# Patient Record
Sex: Male | Born: 1990 | Race: Black or African American | Hispanic: No | Marital: Single | Smoking: Current every day smoker
Health system: Southern US, Community
[De-identification: ages and names within clinical notes are randomized; demographics above are authoritative.]

## PROBLEM LIST (undated history)

## (undated) DIAGNOSIS — F32A Depression, unspecified: Secondary | ICD-10-CM

## (undated) DIAGNOSIS — N200 Calculus of kidney: Secondary | ICD-10-CM

## (undated) DIAGNOSIS — H409 Unspecified glaucoma: Secondary | ICD-10-CM

## (undated) DIAGNOSIS — F319 Bipolar disorder, unspecified: Secondary | ICD-10-CM

---

## 2009-11-12 ENCOUNTER — Emergency Department: Payer: Self-pay | Admitting: Emergency Medicine

## 2010-11-05 ENCOUNTER — Emergency Department: Payer: Self-pay | Admitting: Emergency Medicine

## 2010-11-16 ENCOUNTER — Inpatient Hospital Stay: Payer: Self-pay | Admitting: Unknown Physician Specialty

## 2010-11-23 ENCOUNTER — Emergency Department: Payer: Self-pay | Admitting: Emergency Medicine

## 2011-08-14 ENCOUNTER — Emergency Department: Payer: Self-pay | Admitting: Emergency Medicine

## 2011-08-14 LAB — COMPREHENSIVE METABOLIC PANEL
Albumin: 4.5 g/dL (ref 3.4–5.0)
Anion Gap: 11 (ref 7–16)
BUN: 9 mg/dL (ref 7–18)
Bilirubin,Total: 2.1 mg/dL — ABNORMAL HIGH (ref 0.2–1.0)
Calcium, Total: 9.2 mg/dL (ref 8.5–10.1)
Chloride: 103 mmol/L (ref 98–107)
Co2: 28 mmol/L (ref 21–32)
Creatinine: 1 mg/dL (ref 0.60–1.30)
EGFR (African American): >60
Glucose: 88 mg/dL (ref 65–99)
Osmolality: 281 (ref 275–301)
Potassium: 3.8 mmol/L (ref 3.5–5.1)
SGOT(AST): 18 U/L (ref 15–37)
Sodium: 142 mmol/L (ref 136–145)
Total Protein: 7.6 g/dL (ref 6.4–8.2)

## 2011-08-14 LAB — CBC
HCT: 39.7 % — ABNORMAL LOW (ref 40.0–52.0)
HGB: 13.3 g/dL (ref 13.0–18.0)
MCH: 32.7 pg (ref 26.0–34.0)
MCHC: 33.4 g/dL (ref 32.0–36.0)
Platelet: 260 10*3/uL (ref 150–440)
RBC: 4.05 10*6/uL — ABNORMAL LOW (ref 4.40–5.90)
RDW: 12.7 % (ref 11.5–14.5)
WBC: 6.6 10*3/uL (ref 3.8–10.6)

## 2011-08-14 LAB — ETHANOL: Ethanol %: <0.003 % (ref 0.000–0.080)

## 2011-08-14 LAB — TSH: Thyroid Stimulating Horm: 0.37 u[IU]/mL — ABNORMAL LOW

## 2011-08-14 LAB — ACETAMINOPHEN LEVEL: Acetaminophen: <2 ug/mL

## 2011-08-14 LAB — DRUG SCREEN, URINE
Amphetamines, Ur Screen: NEGATIVE (ref ?–1000)
Benzodiazepine, Ur Scrn: NEGATIVE (ref ?–200)
Cocaine Metabolite,Ur ~~LOC~~: POSITIVE (ref ?–300)
MDMA (Ecstasy)Ur Screen: NEGATIVE (ref ?–500)
Methadone, Ur Screen: NEGATIVE (ref ?–300)
Tricyclic, Ur Screen: NEGATIVE (ref ?–1000)

## 2011-08-14 LAB — SALICYLATE LEVEL: Salicylates, Serum: <1.7 mg/dL

## 2012-01-21 ENCOUNTER — Emergency Department: Payer: Self-pay | Admitting: Emergency Medicine

## 2012-01-21 LAB — CBC
HCT: 40.9 %
HGB: 13.9 g/dL
MCH: 32.5 pg
MCHC: 34 g/dL
MCV: 96 fL
Platelet: 266 x10 3/mm 3
RBC: 4.27 x10 6/mm 3 — ABNORMAL LOW
RDW: 11.9 %
WBC: 5.8 x10 3/mm 3

## 2012-01-21 LAB — DRUG SCREEN, URINE
Amphetamines, Ur Screen: NEGATIVE
Barbiturates, Ur Screen: NEGATIVE
Benzodiazepine, Ur Scrn: NEGATIVE
Cannabinoid 50 Ng, Ur ~~LOC~~: POSITIVE
Cocaine Metabolite,Ur ~~LOC~~: POSITIVE
MDMA (Ecstasy)Ur Screen: NEGATIVE
Methadone, Ur Screen: NEGATIVE
Opiate, Ur Screen: NEGATIVE
Phencyclidine (PCP) Ur S: NEGATIVE
Tricyclic, Ur Screen: NEGATIVE

## 2012-01-21 LAB — COMPREHENSIVE METABOLIC PANEL
Albumin: 4.1 g/dL (ref 3.4–5.0)
Anion Gap: 8 (ref 7–16)
BUN: 9 mg/dL (ref 7–18)
Bilirubin,Total: 3.8 mg/dL — ABNORMAL HIGH (ref 0.2–1.0)
Calcium, Total: 9.4 mg/dL (ref 8.5–10.1)
Chloride: 104 mmol/L (ref 98–107)
EGFR (African American): >60
EGFR (Non-African Amer.): >60
Osmolality: 272 (ref 275–301)
Potassium: 3.8 mmol/L (ref 3.5–5.1)
SGOT(AST): 31 U/L (ref 15–37)
SGPT (ALT): 18 U/L

## 2012-01-21 LAB — ACETAMINOPHEN LEVEL: Acetaminophen: <2 ug/mL

## 2012-01-31 ENCOUNTER — Emergency Department: Payer: Self-pay | Admitting: Emergency Medicine

## 2012-01-31 LAB — COMPREHENSIVE METABOLIC PANEL
Albumin: 4.2 g/dL (ref 3.4–5.0)
Alkaline Phosphatase: 103 U/L (ref 50–136)
BUN: 16 mg/dL (ref 7–18)
Bilirubin,Total: 3.3 mg/dL — ABNORMAL HIGH (ref 0.2–1.0)
Calcium, Total: 9.2 mg/dL (ref 8.5–10.1)
Chloride: 106 mmol/L (ref 98–107)
EGFR (African American): >60
EGFR (Non-African Amer.): >60
Glucose: 95 mg/dL (ref 65–99)
Osmolality: 280 (ref 275–301)
SGPT (ALT): 17 U/L
Sodium: 140 mmol/L (ref 136–145)
Total Protein: 7.5 g/dL (ref 6.4–8.2)

## 2012-01-31 LAB — DRUG SCREEN, URINE
Amphetamines, Ur Screen: NEGATIVE (ref ?–1000)
Barbiturates, Ur Screen: NEGATIVE (ref ?–200)
Cannabinoid 50 Ng, Ur ~~LOC~~: POSITIVE (ref ?–50)
Methadone, Ur Screen: NEGATIVE (ref ?–300)
Opiate, Ur Screen: NEGATIVE (ref ?–300)
Phencyclidine (PCP) Ur S: NEGATIVE (ref ?–25)

## 2012-01-31 LAB — URINALYSIS, COMPLETE
Bilirubin,UR: NEGATIVE
Blood: NEGATIVE
Glucose,UR: NEGATIVE mg/dL (ref 0–75)
Leukocyte Esterase: NEGATIVE
Nitrite: NEGATIVE
Ph: 5 (ref 4.5–8.0)
Specific Gravity: 1.031 (ref 1.003–1.030)
WBC UR: 6 /HPF (ref 0–5)

## 2012-01-31 LAB — CBC
HCT: 41.5 % (ref 40.0–52.0)
HGB: 14.1 g/dL (ref 13.0–18.0)
MCHC: 33.8 g/dL (ref 32.0–36.0)
Platelet: 266 10*3/uL (ref 150–440)
RBC: 4.31 10*6/uL — ABNORMAL LOW (ref 4.40–5.90)
WBC: 4.3 10*3/uL (ref 3.8–10.6)

## 2012-01-31 LAB — ETHANOL
Ethanol %: <0.003 % (ref 0.000–0.080)
Ethanol: <3 mg/dL

## 2012-01-31 LAB — TSH: Thyroid Stimulating Horm: 0.3 u[IU]/mL — ABNORMAL LOW

## 2012-03-05 ENCOUNTER — Emergency Department: Payer: Self-pay | Admitting: Emergency Medicine

## 2012-03-05 LAB — URINALYSIS, COMPLETE
Bacteria: NONE SEEN
Glucose,UR: NEGATIVE mg/dL (ref 0–75)
Leukocyte Esterase: NEGATIVE
Nitrite: NEGATIVE
Ph: 8 (ref 4.5–8.0)
RBC,UR: 1 /HPF (ref 0–5)
Specific Gravity: 1.017 (ref 1.003–1.030)
WBC UR: 1 /HPF (ref 0–5)

## 2012-03-05 LAB — CBC
HCT: 40.8 % (ref 40.0–52.0)
MCV: 96 fL (ref 80–100)
Platelet: 303 10*3/uL (ref 150–440)
RBC: 4.26 10*6/uL — ABNORMAL LOW (ref 4.40–5.90)
WBC: 5.1 10*3/uL (ref 3.8–10.6)

## 2012-03-05 LAB — BASIC METABOLIC PANEL
Anion Gap: 9 (ref 7–16)
BUN: 10 mg/dL (ref 7–18)
Calcium, Total: 9.2 mg/dL (ref 8.5–10.1)
Chloride: 104 mmol/L (ref 98–107)
Co2: 27 mmol/L (ref 21–32)
EGFR (Non-African Amer.): >60
Osmolality: 278 (ref 275–301)

## 2013-02-27 ENCOUNTER — Emergency Department (HOSPITAL_COMMUNITY)
Admission: EM | Admit: 2013-02-27 | Discharge: 2013-02-27 | Disposition: A | Discharge status: Home / Self Care | Payer: Self-pay | Attending: Emergency Medicine | Admitting: Emergency Medicine

## 2013-02-27 ENCOUNTER — Encounter (HOSPITAL_COMMUNITY): Payer: Self-pay | Admitting: Emergency Medicine

## 2013-02-27 ENCOUNTER — Emergency Department (HOSPITAL_COMMUNITY): Payer: Self-pay

## 2013-02-27 DIAGNOSIS — F17200 Nicotine dependence, unspecified, uncomplicated: Secondary | ICD-10-CM | POA: Insufficient documentation

## 2013-02-27 DIAGNOSIS — N23 Unspecified renal colic: Secondary | ICD-10-CM | POA: Insufficient documentation

## 2013-02-27 DIAGNOSIS — R1110 Vomiting, unspecified: Secondary | ICD-10-CM | POA: Insufficient documentation

## 2013-02-27 DIAGNOSIS — Z87442 Personal history of urinary calculi: Secondary | ICD-10-CM | POA: Insufficient documentation

## 2013-02-27 HISTORY — DX: Calculus of kidney: N20.0

## 2013-02-27 LAB — URINE MICROSCOPIC-ADD ON

## 2013-02-27 LAB — POCT I-STAT, CHEM 8
BUN: 10 mg/dL (ref 6–23)
Creatinine, Ser: 1 mg/dL (ref 0.50–1.35)
Hemoglobin: 13.6 g/dL (ref 13.0–17.0)
Potassium: 3.4 mEq/L — ABNORMAL LOW (ref 3.5–5.1)
Sodium: 144 mEq/L (ref 135–145)

## 2013-02-27 LAB — URINALYSIS, ROUTINE W REFLEX MICROSCOPIC
Bilirubin Urine: NEGATIVE
Specific Gravity, Urine: 1.025 (ref 1.005–1.030)
pH: 5.5 (ref 5.0–8.0)

## 2013-02-27 MED ORDER — CEFTRIAXONE SODIUM 1 G IJ SOLR
1.0000 g | Freq: Once | INTRAMUSCULAR | Status: AC
  Administered 2013-02-27: 1 g via INTRAVENOUS
  Filled 2013-02-27: qty 10

## 2013-02-27 MED ORDER — KETOROLAC TROMETHAMINE 10 MG PO TABS: 10.0000 mg | ORAL_TABLET | Freq: Four times a day (QID) | ORAL | Status: DC | PRN

## 2013-02-27 MED ORDER — HYDROMORPHONE HCL PF 1 MG/ML IJ SOLN
1.0000 mg | Freq: Once | INTRAMUSCULAR | Status: AC
  Administered 2013-02-27: 1 mg via INTRAVENOUS
  Filled 2013-02-27: qty 1

## 2013-02-27 MED ORDER — DIAZEPAM 2 MG PO TABS: 2.0000 mg | ORAL_TABLET | Freq: Four times a day (QID) | ORAL | Status: DC | PRN

## 2013-02-27 MED ORDER — ONDANSETRON HCL 4 MG/2ML IJ SOLN
4.0000 mg | Freq: Once | INTRAMUSCULAR | Status: AC
  Administered 2013-02-27: 4 mg via INTRAVENOUS
  Filled 2013-02-27: qty 2

## 2013-02-27 MED ORDER — SULFAMETHOXAZOLE-TRIMETHOPRIM 800-160 MG PO TABS: 1.0000 | ORAL_TABLET | Freq: Two times a day (BID) | ORAL | Status: DC

## 2013-02-27 MED ORDER — LORAZEPAM 2 MG/ML IJ SOLN
1.0000 mg | Freq: Once | INTRAMUSCULAR | Status: AC
  Administered 2013-02-27: 1 mg via INTRAVENOUS
  Filled 2013-02-27: qty 1

## 2013-02-27 MED ORDER — OXYCODONE-ACETAMINOPHEN 5-325 MG PO TABS: 1.0000 | ORAL_TABLET | ORAL | Status: DC | PRN

## 2013-02-27 MED ORDER — KETOROLAC TROMETHAMINE 30 MG/ML IJ SOLN
30.0000 mg | Freq: Once | INTRAMUSCULAR | Status: AC
  Administered 2013-02-27: 30 mg via INTRAVENOUS
  Filled 2013-02-27: qty 1

## 2013-02-27 NOTE — ED Notes (Signed)
Pt is here with left flank and abdominal pain.  Pt has been medicated and resting

## 2013-02-27 NOTE — ED Notes (Signed)
Patient transported to Ultrasound 

## 2013-02-27 NOTE — ED Provider Notes (Signed)
History    CSN: [id number] Arrival date & time 02/27/13  6213  First MD Initiated Contact with Patient 02/27/13 [phone]     Chief Complaint  Patient presents with  . Flank Pain   (Consider location/radiation/quality/duration/timing/severity/associated sxs/prior Treatment) HPI  Yuepheng Deakin is a(n) 22 y.o. male who presents with cc flank pain. Hx kidney stones that have always passed. Acute onset last night, woke him form sleep, severe left sided flank pain, non radiating, intermittent and severe. Patient is actively vomiting. History of admission [facility] for pain and nausea control. Denies fevers, chills, myalgias, arthralgias. Denies DOE, SOB, chest tightness or pressure, radiation to left arm, jaw or back, or diaphoresis. Denies dysuria, suprapubic pain, frequency, urgency, or hematuria. Denies headaches, light headedness, weakness, visual disturbances. Denies abdominal pain, diarrhea or constipation.     Past Medical History  Diagnosis Date  . Nephrolithiasis    History reviewed. No pertinent past surgical history. No family history on file. History  Substance Use Topics  . Smoking status: Current Every Day Smoker  . Smokeless tobacco: Not on file  . Alcohol Use: Yes    Review of Systems Ten systems reviewed and are negative for acute change, except as noted in the HPI.   Allergies  Review of patient's allergies indicates no known allergies.  Home Medications  No current outpatient prescriptions on file. BP 133/94  Temp(Src) 98.3 F (36.8 C) (Oral)  Resp 18  SpO2 99% Physical Exam Physical Exam  Nursing note and vitals reviewed. Constitutional: He appears well-developed and well-nourished. No distress.  HENT:  Head: Normocephalic and atraumatic.  Eyes: Conjunctivae normal are normal. No scleral icterus.  Neck: Normal range of motion. Neck supple.  Cardiovascular: Normal rate, regular rhythm and normal heart sounds.   Pulmonary/Chest: Effort  normal and breath sounds normal. No respiratory distress.  Abdominal: Soft. There is no tenderness.  Musculoskeletal: He exhibits no edema.  Neurological: He is alert.  Skin: Skin is warm and dry. He is not diaphoretic.  Psychiatric: His behavior is normal.    ED Course  Procedures (including critical care time) Labs Reviewed  URINALYSIS, ROUTINE W REFLEX MICROSCOPIC - Abnormal; Notable for the following:    APPearance CLOUDY (*)    Hgb urine dipstick LARGE (*)    Leukocytes, UA MODERATE (*)    All other components within normal limits  URINE MICROSCOPIC-ADD ON - Abnormal; Notable for the following:    Squamous Epithelial / LPF FEW (*)    Bacteria, UA FEW (*)    Crystals CA OXALATE CRYSTALS (*)    All other components within normal limits  POCT I-STAT, CHEM 8 - Abnormal; Notable for the following:    Potassium 3.4 (*)    Glucose, Bld 117 (*)    All other components within normal limits  URINE CULTURE   US Renal  02/27/2013   *RADIOLOGY REPORT*  Clinical Data: Left flank pain, nephrolithiasis  RENAL/URINARY TRACT ULTRASOUND COMPLETE  Comparison:  None  Findings:  Right Kidney:  10.5 cm length. Normal cortical thickness.  Upper normal cortical echogenicity.  No mass, hydronephrosis or shadowing calcification.  No perinephric fluid.  Left Kidney:  11.5 cm length. Normal cortical thickness.  Upper normal cortical echogenicity.  No mass, hydronephrosis shadowing calcification.  No perinephric fluid.  Bladder:  Contains minimal urine, no gross abnormalities seen.  IMPRESSION: No sonographic evidence of urinary tract calcification or hydronephrosis.   Original Report Authenticated By: Ulyses Southward, M.D.   No diagnosis found.  MDM  6:53 AM Patient with nausea and vomiting. Clinical picture, hx fits diagnosis of renal colic.    10:26 AM US shows no signs of hydronephrosis or obstructive uropathy. Patient's urine concerning for infection however patient also has calcium oxalate crystals  and hematuria consistent with the findings of urolithiasis.  Have given the patient 1 g IV Rocephin.  He had had no further episodes of nausea or vomiting.  He states that his pain is much better controlled at this time.  He is tolerating by mouth fluids.  Will discharge the patient with antibiotics, pain medication, antiemetic.  Patient may follow up at community [facility].   Arthor Captain, PA-C 02/27/13 1123

## 2013-02-27 NOTE — ED Notes (Addendum)
PT. REPORTS LEFT FLANK PAIN WITH NAUSEA ONSET LAST NIGHT , DENIES HEMATURIA , PT. STATED HISTORY OF KIDNEY STONE .

## 2013-02-27 NOTE — ED Notes (Signed)
Notified lab to collect blood

## 2013-02-27 NOTE — ED Notes (Signed)
Patient tolerated soda without incident.

## 2013-02-28 LAB — URINE CULTURE: Colony Count: NO GROWTH

## 2013-02-28 NOTE — ED Provider Notes (Signed)
Medical screening examination/treatment/procedure(s) were performed by non-physician practitioner and as supervising physician I was immediately available for consultation/collaboration.   Dione Booze, MD 02/28/13 1451

## 2013-03-15 ENCOUNTER — Emergency Department: Payer: Self-pay | Admitting: Emergency Medicine

## 2013-03-15 LAB — URINALYSIS, COMPLETE
Bacteria: NONE SEEN
Bilirubin,UR: NEGATIVE
Glucose,UR: NEGATIVE mg/dL (ref 0–75)
Ketone: NEGATIVE
Nitrite: NEGATIVE
Ph: 6 (ref 4.5–8.0)
Protein: NEGATIVE
Squamous Epithelial: 1
WBC UR: 3 /HPF (ref 0–5)

## 2013-08-13 ENCOUNTER — Emergency Department (HOSPITAL_COMMUNITY)
Admission: EM | Admit: 2013-08-13 | Discharge: 2013-08-13 | Discharge status: Against Medical Advice | Payer: 59 | Attending: Emergency Medicine | Admitting: Emergency Medicine

## 2013-08-13 ENCOUNTER — Encounter (HOSPITAL_COMMUNITY): Payer: Self-pay | Admitting: Emergency Medicine

## 2013-08-13 DIAGNOSIS — R109 Unspecified abdominal pain: Secondary | ICD-10-CM | POA: Insufficient documentation

## 2013-08-13 DIAGNOSIS — F17200 Nicotine dependence, unspecified, uncomplicated: Secondary | ICD-10-CM | POA: Insufficient documentation

## 2013-08-13 LAB — COMPREHENSIVE METABOLIC PANEL
ALK PHOS: 80 U/L (ref 39–117)
ALT: 16 U/L (ref 0–53)
AST: 20 U/L (ref 0–37)
Albumin: 4.1 g/dL (ref 3.5–5.2)
BUN: 12 mg/dL (ref 6–23)
CHLORIDE: 103 meq/L (ref 96–112)
CO2: 23 meq/L (ref 19–32)
Calcium: 9.5 mg/dL (ref 8.4–10.5)
Creatinine, Ser: 1.09 mg/dL (ref 0.50–1.35)
GLUCOSE: 103 mg/dL — AB (ref 70–99)
POTASSIUM: 4.1 meq/L (ref 3.7–5.3)
SODIUM: 141 meq/L (ref 137–147)
Total Bilirubin: 2.4 mg/dL — ABNORMAL HIGH (ref 0.3–1.2)
Total Protein: 7.4 g/dL (ref 6.0–8.3)

## 2013-08-13 LAB — CBC WITH DIFFERENTIAL/PLATELET
BASOS ABS: 0 10*3/uL (ref 0.0–0.1)
Basophils Relative: 1 % (ref 0–1)
EOS PCT: 2 % (ref 0–5)
Eosinophils Absolute: 0.1 10*3/uL (ref 0.0–0.7)
HCT: 36.7 % — ABNORMAL LOW (ref 39.0–52.0)
Hemoglobin: 12.9 g/dL — ABNORMAL LOW (ref 13.0–17.0)
LYMPHS ABS: 2.3 10*3/uL (ref 0.7–4.0)
LYMPHS PCT: 46 % (ref 12–46)
MCH: 32.7 pg (ref 26.0–34.0)
MCHC: 35.1 g/dL (ref 30.0–36.0)
MCV: 93.1 fL (ref 78.0–100.0)
Monocytes Absolute: 0.3 10*3/uL (ref 0.1–1.0)
Monocytes Relative: 6 % (ref 3–12)
NEUTROS ABS: 2.4 10*3/uL (ref 1.7–7.7)
NEUTROS PCT: 46 % (ref 43–77)
PLATELETS: 284 10*3/uL (ref 150–400)
RBC: 3.94 MIL/uL — ABNORMAL LOW (ref 4.22–5.81)
RDW: 12.1 % (ref 11.5–15.5)
WBC: 5.1 10*3/uL (ref 4.0–10.5)

## 2013-08-13 LAB — URINALYSIS, ROUTINE W REFLEX MICROSCOPIC
GLUCOSE, UA: NEGATIVE mg/dL
KETONES UR: 40 mg/dL — AB
NITRITE: NEGATIVE
PH: 6.5 (ref 5.0–8.0)
PROTEIN: NEGATIVE mg/dL
Specific Gravity, Urine: 1.022 (ref 1.005–1.030)
Urobilinogen, UA: 2 mg/dL — ABNORMAL HIGH (ref 0.0–1.0)

## 2013-08-13 LAB — URINE MICROSCOPIC-ADD ON

## 2013-08-13 NOTE — ED Notes (Signed)
Pt did not answer from waiting room x 1

## 2013-08-13 NOTE — ED Notes (Signed)
Patient no answer x3

## 2013-08-13 NOTE — ED Notes (Signed)
Pt is here with right flank area pain that radiates to lower abdomen and down into grown.  Hx of kidney stones.  Just started

## 2013-09-17 DIAGNOSIS — N200 Calculus of kidney: Secondary | ICD-10-CM | POA: Insufficient documentation

## 2013-12-21 ENCOUNTER — Emergency Department (HOSPITAL_COMMUNITY)
Admission: EM | Admit: 2013-12-21 | Discharge: 2013-12-21 | Disposition: A | Discharge status: Home / Self Care | Payer: Self-pay | Attending: Emergency Medicine | Admitting: Emergency Medicine

## 2013-12-21 ENCOUNTER — Encounter (HOSPITAL_COMMUNITY): Payer: Self-pay | Admitting: Emergency Medicine

## 2013-12-21 ENCOUNTER — Emergency Department (HOSPITAL_COMMUNITY): Payer: Self-pay

## 2013-12-21 ENCOUNTER — Emergency Department (HOSPITAL_COMMUNITY): Payer: 59

## 2013-12-21 DIAGNOSIS — M542 Cervicalgia: Secondary | ICD-10-CM | POA: Insufficient documentation

## 2013-12-21 DIAGNOSIS — M79609 Pain in unspecified limb: Secondary | ICD-10-CM | POA: Insufficient documentation

## 2013-12-21 DIAGNOSIS — F17200 Nicotine dependence, unspecified, uncomplicated: Secondary | ICD-10-CM | POA: Insufficient documentation

## 2013-12-21 DIAGNOSIS — R319 Hematuria, unspecified: Secondary | ICD-10-CM | POA: Insufficient documentation

## 2013-12-21 DIAGNOSIS — Z87442 Personal history of urinary calculi: Secondary | ICD-10-CM | POA: Insufficient documentation

## 2013-12-21 DIAGNOSIS — M25519 Pain in unspecified shoulder: Secondary | ICD-10-CM | POA: Insufficient documentation

## 2013-12-21 DIAGNOSIS — R109 Unspecified abdominal pain: Secondary | ICD-10-CM | POA: Insufficient documentation

## 2013-12-21 DIAGNOSIS — M79603 Pain in arm, unspecified: Secondary | ICD-10-CM

## 2013-12-21 LAB — URINALYSIS, ROUTINE W REFLEX MICROSCOPIC
Bilirubin Urine: NEGATIVE
GLUCOSE, UA: NEGATIVE mg/dL
HGB URINE DIPSTICK: NEGATIVE
Ketones, ur: NEGATIVE mg/dL
Nitrite: NEGATIVE
PROTEIN: NEGATIVE mg/dL
SPECIFIC GRAVITY, URINE: 1.021 (ref 1.005–1.030)
UROBILINOGEN UA: 0.2 mg/dL (ref 0.0–1.0)
pH: 6 (ref 5.0–8.0)

## 2013-12-21 LAB — URINE MICROSCOPIC-ADD ON

## 2013-12-21 MED ORDER — OXYCODONE-ACETAMINOPHEN 5-325 MG PO TABS
2.0000 | ORAL_TABLET | Freq: Once | ORAL | Status: AC
  Administered 2013-12-21: 2 via ORAL
  Filled 2013-12-21: qty 2

## 2013-12-21 MED ORDER — CEPHALEXIN 500 MG PO CAPS: 500.0000 mg | ORAL_CAPSULE | Freq: Four times a day (QID) | ORAL | Status: DC
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
## 2013-12-21 NOTE — Discharge Instructions (Signed)
Abdominal Pain, Adult  Many things can cause abdominal pain. Usually, abdominal pain is not caused by a disease and will improve without treatment. It can often be observed and treated at home. Your health care provider will do a physical exam and possibly order blood tests and X-rays to help determine the seriousness of your pain. However, in many cases, more time must pass before a clear cause of the pain can be found. Before that point, your health care provider may not know if you need more testing or further treatment.  HOME CARE INSTRUCTIONS   Monitor your abdominal pain for any changes. The following actions may help to alleviate any discomfort you are experiencing:   Only take over-the-counter or prescription medicines as directed by your health care provider.   Do not take laxatives unless directed to do so by your health care provider.   Try a clear liquid diet (broth, tea, or water) as directed by your health care provider. Slowly move to a bland diet as tolerated.  SEEK MEDICAL CARE IF:   You have unexplained abdominal pain.   You have abdominal pain associated with nausea or diarrhea.   You have pain when you urinate or have a bowel movement.   You experience abdominal pain that wakes you in the night.   You have abdominal pain that is worsened or improved by eating food.   You have abdominal pain that is worsened with eating fatty foods.  SEEK IMMEDIATE MEDICAL CARE IF:    Your pain does not go away within 2 hours.   You have a fever.   You keep throwing up (vomiting).   Your pain is felt only in portions of the abdomen, such as the right side or the left lower portion of the abdomen.   You pass bloody or black tarry stools.  MAKE SURE YOU:   Understand these instructions.    Will watch your condition.    Will get help right away if you are not doing well or get worse.   Document Released: 05/04/2005 Document Revised: 05/15/2013 Document Reviewed: 04/03/2013  [employer] Patient  Information 2014 [employer]          Hematuria, Adult  Hematuria is blood in your urine. It can be caused by a bladder infection, kidney infection, prostate infection, kidney stone, or cancer of your urinary tract. Infections can usually be treated with medicine, and a kidney stone usually will pass through your urine. If neither of these is the cause of your hematuria, further workup to find out the reason may be needed.  It is very important that you tell your health care provider about any blood you see in your urine, even if the blood stops without treatment or happens without causing pain. Blood in your urine that happens and then stops and then happens again can be a symptom of a very serious condition. Also, pain is not a symptom in the initial stages of many urinary cancers.  HOME CARE INSTRUCTIONS    Drink lots of fluid, 3 4 quarts a day. If you have been diagnosed with an infection, cranberry juice is especially recommended, in addition to large amounts of water.   Avoid caffeine, tea, and carbonated beverages, because they tend to irritate the bladder.   Avoid alcohol because it may irritate the prostate.   Only take over-the-counter or prescription medicines for pain, discomfort, or fever as directed by your health care provider.   If you have been diagnosed with a   kidney stone, follow your health care provider's instructions regarding straining your urine to catch the stone.   Empty your bladder often. Avoid holding urine for long periods of time.   After a bowel movement, women should cleanse front to back. Use each tissue only once.   Empty your bladder before and after sexual intercourse if you are a male.  SEEK MEDICAL CARE IF:  You develop back pain, fever, a feeling of sickness in your stomach (nausea), or vomiting or if your symptoms are not better in 3 days. Return sooner if you are getting worse.  SEEK IMMEDIATE MEDICAL CARE IF:    You have a persistent fever, with a temperature  of 101.8F (38.8C) or greater.   You develop severe vomiting and are unable to keep the medicine down.   You develop severe back or abdominal pain despite taking your medicines.   You begin passing a large amount of blood or clots in your urine.   You feel extremely weak or faint, or you pass out.  MAKE SURE YOU:    Understand these instructions.   Will watch your condition.   Will get help right away if you are not doing well or get worse.  Document Released: 07/25/2005 Document Revised: 05/15/2013 Document Reviewed: 03/25/2013  [employer] Patient Information 2014 [employer]

## 2013-12-21 NOTE — ED Notes (Signed)
Patient transported to CT 
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
## 2013-12-21 NOTE — ED Notes (Signed)
Patient transported to X-ray 

## 2013-12-23 LAB — URINE CULTURE
Colony Count: NO GROWTH
Culture: NO GROWTH
SPECIAL REQUESTS: NORMAL

## 2014-02-25 ENCOUNTER — Emergency Department (HOSPITAL_COMMUNITY)
Admission: EM | Admit: 2014-02-25 | Discharge: 2014-02-25 | Disposition: A | Discharge status: Home / Self Care | Payer: 59 | Attending: Emergency Medicine | Admitting: Emergency Medicine

## 2014-02-25 ENCOUNTER — Encounter (HOSPITAL_COMMUNITY): Payer: Self-pay | Admitting: Emergency Medicine

## 2014-02-25 DIAGNOSIS — F17200 Nicotine dependence, unspecified, uncomplicated: Secondary | ICD-10-CM | POA: Insufficient documentation

## 2014-02-25 DIAGNOSIS — R109 Unspecified abdominal pain: Secondary | ICD-10-CM | POA: Insufficient documentation

## 2014-02-25 DIAGNOSIS — Z87448 Personal history of other diseases of urinary system: Secondary | ICD-10-CM | POA: Insufficient documentation

## 2014-02-25 LAB — CBC WITH DIFFERENTIAL/PLATELET
Basophils Absolute: 0 10*3/uL (ref 0.0–0.1)
Basophils Relative: 1 % (ref 0–1)
Eosinophils Absolute: 0.2 10*3/uL (ref 0.0–0.7)
Eosinophils Relative: 3 % (ref 0–5)
HCT: 38.8 % — ABNORMAL LOW (ref 39.0–52.0)
HEMOGLOBIN: 13.2 g/dL (ref 13.0–17.0)
LYMPHS ABS: 2.7 10*3/uL (ref 0.7–4.0)
LYMPHS PCT: 46 % (ref 12–46)
MCH: 32 pg (ref 26.0–34.0)
MCHC: 34 g/dL (ref 30.0–36.0)
MCV: 94.2 fL (ref 78.0–100.0)
MONOS PCT: 5 % (ref 3–12)
Monocytes Absolute: 0.3 10*3/uL (ref 0.1–1.0)
NEUTROS PCT: 45 % (ref 43–77)
Neutro Abs: 2.7 10*3/uL (ref 1.7–7.7)
PLATELETS: 260 10*3/uL (ref 150–400)
RBC: 4.12 MIL/uL — AB (ref 4.22–5.81)
RDW: 12 % (ref 11.5–15.5)
WBC: 5.9 10*3/uL (ref 4.0–10.5)

## 2014-02-25 LAB — BASIC METABOLIC PANEL
Anion gap: 15 (ref 5–15)
BUN: 7 mg/dL (ref 6–23)
CHLORIDE: 101 meq/L (ref 96–112)
CO2: 24 meq/L (ref 19–32)
Calcium: 9.2 mg/dL (ref 8.4–10.5)
Creatinine, Ser: 1.02 mg/dL (ref 0.50–1.35)
GFR calc Af Amer: >90 mL/min (ref >90)
GFR calc non Af Amer: >90 mL/min (ref >90)
GLUCOSE: 103 mg/dL — AB (ref 70–99)
POTASSIUM: 3.9 meq/L (ref 3.7–5.3)
SODIUM: 140 meq/L (ref 137–147)

## 2014-02-25 LAB — URINE MICROSCOPIC-ADD ON

## 2014-02-25 LAB — URINALYSIS, ROUTINE W REFLEX MICROSCOPIC
Bilirubin Urine: NEGATIVE
GLUCOSE, UA: NEGATIVE mg/dL
HGB URINE DIPSTICK: NEGATIVE
Ketones, ur: 15 mg/dL — AB
Nitrite: NEGATIVE
PH: 6.5 (ref 5.0–8.0)
PROTEIN: NEGATIVE mg/dL
SPECIFIC GRAVITY, URINE: 1.023 (ref 1.005–1.030)
Urobilinogen, UA: 1 mg/dL (ref 0.0–1.0)

## 2014-02-25 MED ORDER — TRAMADOL-ACETAMINOPHEN 37.5-325 MG PO TABS: 1.0000 | ORAL_TABLET | Freq: Four times a day (QID) | ORAL | Status: DC | PRN

## 2014-02-25 MED ORDER — OXYCODONE-ACETAMINOPHEN 5-325 MG PO TABS
1.0000 | ORAL_TABLET | Freq: Once | ORAL | Status: AC
  Administered 2014-02-25: 1 via ORAL
  Filled 2014-02-25: qty 1

## 2014-02-25 NOTE — ED Notes (Signed)
Per patient: hx of kidney stone, right flank/groin pain since last night, burning sensation with urination noticed a little blood.  Denies n/v/d Denies passing stone.  Denies fever chills. States he used cocaine and marijuana for pain management, last cocaine use yesterday. Pain 5/10

## 2014-02-25 NOTE — Discharge Instructions (Signed)
Take the prescribed medication as directed. Follow-up with [facility] as needed-- call and schedule appt. Return to the ED for new or worsening symptoms.

## 2014-02-25 NOTE — ED Provider Notes (Signed)
CSN: [id number]     Arrival date & time 02/25/14  7846 History   First MD Initiated Contact with Patient 02/25/14 [phone]     Chief Complaint  Patient presents with  . Flank Pain    right     (Consider location/radiation/quality/duration/timing/severity/associated sxs/prior Treatment) Patient is a 23 y.o. male presenting with flank pain. The history is provided by the patient and medical records.  Flank Pain  This is a 23 year old male with past medical history significant for kidney stones, presenting to the ED for right flank pain and right coronary pain, onset last night. Patient notes some mild dysuria and hematuria this morning. He denies any urethral discharge. No new sexual partners or concern for STD. Patient has not any nausea, vomiting, or diarrhea. He denies any fever or chills. He has been using cocaine and marijuana for pain management, last use was yesterday. He denies chest pain, palpitations, or SOB.  He states his pain is mild at this time. He is followed by urology in [facility], is not scheduled to see them until next year.  Past Medical History  Diagnosis Date  . Nephrolithiasis    History reviewed. No pertinent past surgical history. No family history on file. History  Substance Use Topics  . Smoking status: Current Every Day Smoker  . Smokeless tobacco: Not on file  . Alcohol Use: Yes    Review of Systems  Genitourinary: Positive for dysuria, hematuria and flank pain.  All other systems reviewed and are negative.     Allergies  Peanuts  Home Medications   Prior to Admission medications   Medication Sig Start Date End Date Taking? Authorizing Provider  diphenhydrAMINE (SOMINEX) 25 MG tablet Take 25-50 mg by mouth as needed for allergies (or allergic reaction).   Yes Historical Provider, MD  naproxen sodium (ANAPROX) 220 MG tablet Take 220 mg by mouth 2 (two) times daily as needed (for pain).   Yes Historical Provider, MD  QUEtiapine Fumarate (SEROQUEL  XR) 150 MG 24 hr tablet Take 150 mg by mouth at bedtime.   Yes Historical Provider, MD   BP 110/63  Pulse 76  Temp(Src) 98.2 F (36.8 C) (Oral)  Resp 17  Ht 5\' 11"  (1.803 m)  Wt 130 lb (58.968 kg)  BMI 18.14 kg/m2  SpO2 99%  Physical Exam  Nursing note and vitals reviewed. Constitutional: He is oriented to person, place, and time. He appears well-developed and well-nourished. No distress.  HENT:  Head: Normocephalic and atraumatic.  Mouth/Throat: Oropharynx is clear and moist.  Eyes: Conjunctivae and EOM are normal. Pupils are equal, round, and reactive to light.  Neck: Normal range of motion. Neck supple.  Cardiovascular: Normal rate, regular rhythm and normal heart sounds.   Pulmonary/Chest: Effort normal and breath sounds normal. No respiratory distress. He has no wheezes.  Abdominal: Soft. Bowel sounds are normal. There is no tenderness. There is no guarding and no CVA tenderness.  Abdomen soft, nondistended, no peritoneal signs No CVA tenderness  Musculoskeletal: Normal range of motion.  Neurological: He is alert and oriented to person, place, and time.  Skin: Skin is warm and dry. He is not diaphoretic.  Psychiatric: He has a normal mood and affect.    ED Course  Procedures (including critical care time) Labs Review Labs Reviewed  CBC WITH DIFFERENTIAL - Abnormal; Notable for the following:    RBC 4.12 (*)    HCT 38.8 (*)    All other components within normal limits  BASIC METABOLIC  PANEL - Abnormal; Notable for the following:    Glucose, Bld 103 (*)    All other components within normal limits  URINALYSIS, ROUTINE W REFLEX MICROSCOPIC - Abnormal; Notable for the following:    Ketones, ur 15 (*)    Leukocytes, UA SMALL (*)    All other components within normal limits  URINE MICROSCOPIC-ADD ON - Abnormal; Notable for the following:    Bacteria, UA FEW (*)    Crystals CA OXALATE CRYSTALS (*)    All other components within normal limits    Imaging Review No  results found.   EKG Interpretation None      MDM   Final diagnoses:  Flank pain   23 year old male with history of kidney stones presenting with right flank pain, dysuria, and hematuria. On exam he is afebrile and overall nontoxic appearing. He has no focal tenderness on my exam.  Will obtain basic labs and UA. Percocet given for pain.  Labs are reassuring, renal function WNL. UA without noted blood to suggest stone at present time, non-infectious. No urethral discharge, new sexual partners, or concern for STD at this time.  On review of medical records, patient had a recent CT scan in May 2015 without any evidence of urinary tract stones.  Pain is well-controlled with PO percocet, patient is currently in room joking and laughing with significant other, eating and drinking without difficulty.  Patient was discharged home with pain medication. He'll follow-up with urology if problems occur.  Discussed plan with patient, he/she acknowledged understanding and agreed with plan of care.  Return precautions given for new or worsening symptoms.  Garlon HatchetLisa M Early Steel, PA-C 02/25/14 1533

## 2014-03-05 NOTE — ED Provider Notes (Signed)
Medical screening examination/treatment/procedure(s) were performed by non-physician practitioner and as supervising physician I was immediately available for consultation/collaboration.   EKG Interpretation None        Rolland PorterMark Quintavious Rinck, MD 03/05/14 [phone]

## 2015-03-10 ENCOUNTER — Emergency Department (HOSPITAL_COMMUNITY)
Admission: EM | Admit: 2015-03-10 | Discharge: 2015-03-10 | Disposition: A | Discharge status: Home / Self Care | Payer: PRIVATE HEALTH INSURANCE | Attending: Emergency Medicine | Admitting: Emergency Medicine

## 2015-03-10 ENCOUNTER — Encounter (HOSPITAL_COMMUNITY): Payer: Self-pay

## 2015-03-10 ENCOUNTER — Emergency Department (HOSPITAL_COMMUNITY): Payer: PRIVATE HEALTH INSURANCE

## 2015-03-10 DIAGNOSIS — R109 Unspecified abdominal pain: Secondary | ICD-10-CM | POA: Diagnosis present

## 2015-03-10 DIAGNOSIS — R197 Diarrhea, unspecified: Secondary | ICD-10-CM | POA: Insufficient documentation

## 2015-03-10 DIAGNOSIS — Z87448 Personal history of other diseases of urinary system: Secondary | ICD-10-CM | POA: Insufficient documentation

## 2015-03-10 DIAGNOSIS — Z87442 Personal history of urinary calculi: Secondary | ICD-10-CM | POA: Insufficient documentation

## 2015-03-10 DIAGNOSIS — R300 Dysuria: Secondary | ICD-10-CM | POA: Insufficient documentation

## 2015-03-10 DIAGNOSIS — R1084 Generalized abdominal pain: Secondary | ICD-10-CM | POA: Diagnosis not present

## 2015-03-10 DIAGNOSIS — Z720 Tobacco use: Secondary | ICD-10-CM | POA: Insufficient documentation

## 2015-03-10 HISTORY — DX: Calculus of kidney: N20.0

## 2015-03-10 LAB — CBC WITH DIFFERENTIAL/PLATELET
BASOS PCT: 1 % (ref 0–1)
Basophils Absolute: 0 10*3/uL (ref 0.0–0.1)
EOS ABS: 0.1 10*3/uL (ref 0.0–0.7)
Eosinophils Relative: 1 % (ref 0–5)
HCT: 43.5 % (ref 39.0–52.0)
HEMOGLOBIN: 14.6 g/dL (ref 13.0–17.0)
LYMPHS ABS: 2.2 10*3/uL (ref 0.7–4.0)
Lymphocytes Relative: 32 % (ref 12–46)
MCH: 32 pg (ref 26.0–34.0)
MCHC: 33.6 g/dL (ref 30.0–36.0)
MCV: 95.4 fL (ref 78.0–100.0)
MONO ABS: 0.7 10*3/uL (ref 0.1–1.0)
Monocytes Relative: 10 % (ref 3–12)
Neutro Abs: 4 10*3/uL (ref 1.7–7.7)
Neutrophils Relative %: 56 % (ref 43–77)
Platelets: 271 10*3/uL (ref 150–400)
RBC: 4.56 MIL/uL (ref 4.22–5.81)
RDW: 12.2 % (ref 11.5–15.5)
WBC: 7 10*3/uL (ref 4.0–10.5)

## 2015-03-10 LAB — COMPREHENSIVE METABOLIC PANEL
ALT: 15 U/L — AB (ref 17–63)
AST: 16 U/L (ref 15–41)
Albumin: 4.4 g/dL (ref 3.5–5.0)
Alkaline Phosphatase: 97 U/L (ref 38–126)
Anion gap: 10 (ref 5–15)
BILIRUBIN TOTAL: 3 mg/dL — AB (ref 0.3–1.2)
BUN: 11 mg/dL (ref 6–20)
CALCIUM: 9.8 mg/dL (ref 8.9–10.3)
CO2: 23 mmol/L (ref 22–32)
Chloride: 103 mmol/L (ref 101–111)
Creatinine, Ser: 1.07 mg/dL (ref 0.61–1.24)
GFR calc Af Amer: >60 mL/min (ref >60)
GFR calc non Af Amer: >60 mL/min (ref >60)
Glucose, Bld: 87 mg/dL (ref 65–99)
Potassium: 4 mmol/L (ref 3.5–5.1)
SODIUM: 136 mmol/L (ref 135–145)
Total Protein: 7.9 g/dL (ref 6.5–8.1)

## 2015-03-10 LAB — URINE MICROSCOPIC-ADD ON

## 2015-03-10 LAB — URINALYSIS, ROUTINE W REFLEX MICROSCOPIC
Glucose, UA: NEGATIVE mg/dL
Hgb urine dipstick: NEGATIVE
Ketones, ur: NEGATIVE mg/dL
NITRITE: NEGATIVE
Protein, ur: NEGATIVE mg/dL
SPECIFIC GRAVITY, URINE: 1.027 (ref 1.005–1.030)
Urobilinogen, UA: 1 mg/dL (ref 0.0–1.0)
pH: 6 (ref 5.0–8.0)

## 2015-03-10 LAB — POC OCCULT BLOOD, ED: Fecal Occult Bld: NEGATIVE

## 2015-03-10 LAB — LIPASE, BLOOD: LIPASE: 12 U/L — AB (ref 22–51)

## 2015-03-10 MED ORDER — IOHEXOL 300 MG/ML  SOLN
50.0000 mL | Freq: Once | INTRAMUSCULAR | Status: AC | PRN
  Administered 2015-03-10: 50 mL via ORAL

## 2015-03-10 MED ORDER — CEFTRIAXONE SODIUM 250 MG IJ SOLR
250.0000 mg | Freq: Once | INTRAMUSCULAR | Status: AC
  Administered 2015-03-10: 250 mg via INTRAMUSCULAR
  Filled 2015-03-10: qty 250

## 2015-03-10 MED ORDER — ONDANSETRON HCL 4 MG/2ML IJ SOLN
4.0000 mg | Freq: Once | INTRAMUSCULAR | Status: AC
  Administered 2015-03-10: 4 mg via INTRAVENOUS
  Filled 2015-03-10: qty 2

## 2015-03-10 MED ORDER — ONDANSETRON HCL 4 MG PO TABS: 4.0000 mg | ORAL_TABLET | Freq: Four times a day (QID) | ORAL | Status: DC

## 2015-03-10 MED ORDER — SODIUM CHLORIDE 0.9 % IV BOLUS (SEPSIS)
1000.0000 mL | Freq: Once | INTRAVENOUS | Status: AC
  Administered 2015-03-10: 1000 mL via INTRAVENOUS

## 2015-03-10 MED ORDER — AZITHROMYCIN 250 MG PO TABS
1000.0000 mg | ORAL_TABLET | Freq: Once | ORAL | Status: AC
  Administered 2015-03-10: 1000 mg via ORAL
  Filled 2015-03-10: qty 4

## 2015-03-10 MED ORDER — IOHEXOL 300 MG/ML  SOLN
100.0000 mL | Freq: Once | INTRAMUSCULAR | Status: AC | PRN
  Administered 2015-03-10: 80 mL via INTRAVENOUS

## 2015-03-10 MED ORDER — MORPHINE SULFATE 4 MG/ML IJ SOLN
4.0000 mg | Freq: Once | INTRAMUSCULAR | Status: AC
  Administered 2015-03-10: 4 mg via INTRAVENOUS
  Filled 2015-03-10: qty 1

## 2015-03-10 MED ORDER — TRAMADOL HCL 50 MG PO TABS: 50.0000 mg | ORAL_TABLET | Freq: Four times a day (QID) | ORAL | Status: DC | PRN

## 2015-03-10 NOTE — Discharge Instructions (Signed)
Bloody Diarrhea Bloody diarrhea can be caused by many different conditions. Most of the time bloody diarrhea is the result of food poisoning or minor infections. Bloody diarrhea usually improves over 2 to 3 days of rest and fluid replacement. Other conditions that can cause bloody diarrhea include:  Internal bleeding.  Infection.  Diseases of the bowel and colon. Internal bleeding from an ulcer or bowel disease can be severe and requires hospital care or even surgery. DIAGNOSIS  To find out what is wrong your caregiver may check your:  Stool.  Blood.  Results from a test that looks inside the body (endoscopy). TREATMENT   Get plenty of rest.  Drink enough water and fluids to keep your urine clear or pale yellow.  Do not smoke.  Solid foods and dairy products should be avoided until your illness improves.  As you improve, slowly return to a regular diet with easily-digested foods first. Examples are:  Bananas.  Rice.  Toast.  Crackers. You should only need these for about 2 days before adding more normal foods to your diet.  Avoid spicy or fatty foods as well as caffeine and alcohol for several days.  Medicine to control cramping and diarrhea can relieve symptoms but may prolong some cases of bloody diarrhea. Antibiotics can speed recovery from diarrhea due to some bacterial infections. Call your caregiver if diarrhea does not get better in 3 days. SEEK MEDICAL CARE IF:   You do not improve after 3 days.  Your diarrhea improves but your stool appears black. SEEK IMMEDIATE MEDICAL CARE IF:   You become extremely weak or faint.  You become very sweaty.  You have increased pain or bleeding.  You develop repeated vomiting.  You vomit and you see blood or the vomit looks black in color.  You have a fever. Document Released: 07/25/2005 Document Revised: 10/17/2011 Document Reviewed: 06/26/2009 [facility] Patient Information 2015 [place], [place]. This information is  not intended to replace advice given to you by your health care provider. Make sure you discuss any questions you have with your health care provider.  Abdominal Pain Many things can cause abdominal pain. Usually, abdominal pain is not caused by a disease and will improve without treatment. It can often be observed and treated at home. Your health care provider will do a physical exam and possibly order blood tests and X-rays to help determine the seriousness of your pain. However, in many cases, more time must pass before a clear cause of the pain can be found. Before that point, your health care provider may not know if you need more testing or further treatment. HOME CARE INSTRUCTIONS  Monitor your abdominal pain for any changes. The following actions may help to alleviate any discomfort you are experiencing:  Only take over-the-counter or prescription medicines as directed by your health care provider.  Do not take laxatives unless directed to do so by your health care provider.  Try a clear liquid diet (broth, tea, or water) as directed by your health care provider. Slowly move to a bland diet as tolerated. SEEK MEDICAL CARE IF:  You have unexplained abdominal pain.  You have abdominal pain associated with nausea or diarrhea.  You have pain when you urinate or have a bowel movement.  You experience abdominal pain that wakes you in the night.  You have abdominal pain that is worsened or improved by eating food.  You have abdominal pain that is worsened with eating fatty foods.  You have a fever. SEEK  IMMEDIATE MEDICAL CARE IF:   Your pain does not go away within 2 hours.  You keep throwing up (vomiting).  Your pain is felt only in portions of the abdomen, such as the right side or the left lower portion of the abdomen.  You pass bloody or black tarry stools. MAKE SURE YOU:  Understand these instructions.   Will watch your condition.   Will get help right away if you  are not doing well or get worse.  Document Released: 05/04/2005 Document Revised: 07/30/2013 Document Reviewed: 04/03/2013 [facility] Patient Information 2015 [place], [place]. This information is not intended to replace advice given to you by your health care provider. Make sure you discuss any questions you have with your health care provider.  Dysuria Dysuria is the medical term for pain with urination. There are many causes for dysuria, but urinary tract infection is the most common. If a urinalysis was performed it can show that there is a urinary tract infection. A urine culture confirms that you or your child is sick. You will need to follow up with a healthcare provider because:  If a urine culture was done you will need to know the culture results and treatment recommendations.  If the urine culture was positive, you or your child will need to be put on antibiotics or know if the antibiotics prescribed are the right antibiotics for your urinary tract infection.  If the urine culture is negative (no urinary tract infection), then other causes may need to be explored or antibiotics need to be stopped. Today laboratory work may have been done and there does not seem to be an infection. If cultures were done they will take at least 24 to 48 hours to be completed. Today x-rays may have been taken and they read as normal. No cause can be found for the problems. The x-rays may be re-read by a radiologist and you will be contacted if additional findings are made. You or your child may have been put on medications to help with this problem until you can see your primary caregiver. If the problems get better, see your primary caregiver if the problems return. If you were given antibiotics (medications which kill germs), take all of the mediations as directed for the full course of treatment.  If laboratory work was done, you need to find the results. Leave a telephone number where you can be reached. If  this is not possible, make sure you find out how you are to get test results. HOME CARE INSTRUCTIONS   Drink lots of fluids. For adults, drink eight, 8 ounce glasses of clear juice or water a day. For children, replace fluids as suggested by your caregiver.  Empty the bladder often. Avoid holding urine for long periods of time.  After a bowel movement, women should cleanse front to back, using each tissue only once.  Empty your bladder before and after sexual intercourse.  Take all the medicine given to you until it is gone. You may feel better in a few days, but TAKE ALL MEDICINE.  Avoid caffeine, tea, alcohol and carbonated beverages, because they tend to irritate the bladder.  In men, alcohol may irritate the prostate.  Only take over-the-counter or prescription medicines for pain, discomfort, or fever as directed by your caregiver.  If your caregiver has given you a follow-up appointment, it is very important to keep that appointment. Not keeping the appointment could result in a chronic or permanent injury, pain, and disability. If there  is any problem keeping the appointment, you must call back to this facility for assistance. SEEK IMMEDIATE MEDICAL CARE IF:   Back pain develops.  A fever develops.  There is nausea (feeling sick to your stomach) or vomiting (throwing up).  Problems are no better with medications or are getting worse. MAKE SURE YOU:   Understand these instructions.  Will watch your condition.  Will get help right away if you are not doing well or get worse. Document Released: 04/22/2004 Document Revised: 10/17/2011 Document Reviewed: 02/28/2008 [facility] Patient Information 2015 [place], [place]. This information is not intended to replace advice given to you by your health care provider. Make sure you discuss any questions you have with your health care provider.

## 2015-03-10 NOTE — ED Notes (Signed)
Multiple complaints:  Pt states right side flank pain since Sunday.  No fever.  No n/v.  Some diarrhea.  Pt has hx of kidney stones.  Pt states he can see blood in urine.  Pt states blood in stool since last week.  Every day.  Red in nature.  Also has bite to face. ? Insect bite

## 2015-03-10 NOTE — ED Notes (Signed)
Patient transported to CT 

## 2015-03-10 NOTE — ED Provider Notes (Signed)
CSN: [id number]     Arrival date & time 03/10/15  4098 History   First MD Initiated Contact with Patient 03/10/15 1031     Chief Complaint  Patient presents with  . Flank Pain  . Insect Bite     (Consider location/radiation/quality/duration/timing/severity/associated sxs/prior Treatment) HPI   Patient is a 24 year old male history of kidney stones who presents with right-sided flank pain which he describes as a "rolling, poking," sensation which is constant and worse at night or when laying down, rated 8 out of 10.  He complains of dysuria and increased urinary frequency. He has also had a week of diarrhea with blood in the toilet and one episode of vomiting.  He has generalized abdominal pain as well, with decreased appetite and nausea. He denies any fever, chills, sweats, lightheadedness, chest pain or shortness of breath.  Past Medical History  Diagnosis Date  . Nephrolithiasis   . Kidney stones    History reviewed. No pertinent past surgical history. History reviewed. No pertinent family history. History  Substance Use Topics  . Smoking status: Current Every Day Smoker  . Smokeless tobacco: Not on file  . Alcohol Use: Yes     Comment: social    Review of Systems  Constitutional: Negative.   HENT: Negative.   Eyes: Negative.   Respiratory: Negative.   Cardiovascular: Negative.   Gastrointestinal: Positive for abdominal pain. Negative for constipation, abdominal distention and rectal pain.  Endocrine: Negative.   Musculoskeletal: Negative.   Skin: Negative.   Neurological: Negative for syncope, weakness and light-headedness.  Psychiatric/Behavioral: Negative.    Allergies  Peanuts  Home Medications   Prior to Admission medications   Medication Sig Start Date End Date Taking? Authorizing Provider  traMADol-acetaminophen (ULTRACET) 37.5-325 MG per tablet Take 1 tablet by mouth every 6 (six) hours as needed. Patient not taking: Reported on 03/10/2015 02/25/14   Garlon Hatchet, PA-C   BP 127/89 mmHg  Pulse 99  Temp(Src) 98.2 F (36.8 C) (Oral)  Resp 18  Ht 5\' 11"  (1.803 m)  Wt 120 lb (54.432 kg)  BMI 16.74 kg/m2  SpO2 98% Physical Exam  Constitutional: He is oriented to person, place, and time. He appears well-developed and well-nourished. No distress.  HENT:  Head: Normocephalic and atraumatic.  Nose: Nose normal.  Mouth/Throat: Oropharynx is clear and moist. No oropharyngeal exudate.  Eyes: Conjunctivae and EOM are normal. Pupils are equal, round, and reactive to light. Right eye exhibits no discharge. Left eye exhibits no discharge. No scleral icterus.  Neck: Normal range of motion. No JVD present. No tracheal deviation present. No thyromegaly present.  Cardiovascular: Normal rate, regular rhythm, normal heart sounds and intact distal pulses.  Exam reveals no gallop and no friction rub.   No murmur heard. Pulmonary/Chest: Effort normal and breath sounds normal. No accessory muscle usage. No tachypnea. No respiratory distress. He has no decreased breath sounds. He has no wheezes. He has no rhonchi. He has no rales. He exhibits no tenderness.  Abdominal: Soft. Normal appearance and bowel sounds are normal. He exhibits no distension and no mass. There is generalized tenderness. There is CVA tenderness. There is no rigidity, no rebound and no guarding. No hernia.  Genitourinary: Testes normal and penis normal. Rectal exam shows tenderness. Rectal exam shows no external hemorrhoid, no internal hemorrhoid, no fissure, no mass and anal tone normal. Guaiac negative stool. Right testis shows no mass and no tenderness. Left testis shows no mass and no tenderness. Circumcised. No penile  erythema or penile tenderness. No discharge found.  Musculoskeletal: Normal range of motion. He exhibits no edema or tenderness.  Lymphadenopathy:    He has no cervical adenopathy.  Neurological: He is alert and oriented to person, place, and time. He has normal reflexes. No  cranial nerve deficit. He exhibits normal muscle tone. Coordination normal.  Skin: Skin is warm and dry. No rash noted. He is not diaphoretic. No erythema. No pallor.  Psychiatric: He has a normal mood and affect. His behavior is normal. Judgment and thought content normal.  Nursing note and vitals reviewed.   ED Course  Procedures (including critical care time) Labs Review Labs Reviewed  URINALYSIS, ROUTINE W REFLEX MICROSCOPIC (NOT AT [facility]) - Abnormal; Notable for the following:    Color, Urine ORANGE (*)    APPearance CLOUDY (*)    Bilirubin Urine SMALL (*)    Leukocytes, UA SMALL (*)    All other components within normal limits  URINE MICROSCOPIC-ADD ON - Abnormal; Notable for the following:    Bacteria, UA FEW (*)    All other components within normal limits  COMPREHENSIVE METABOLIC PANEL - Abnormal; Notable for the following:    ALT 15 (*)    Total Bilirubin 3.0 (*)    All other components within normal limits  LIPASE, BLOOD - Abnormal; Notable for the following:    Lipase 12 (*)    All other components within normal limits  CBC WITH DIFFERENTIAL/PLATELET  HIV ANTIBODY (ROUTINE TESTING)  RPR  POC OCCULT BLOOD, ED  GC/CHLAMYDIA PROBE AMP ([place]) NOT AT [facility]    Imaging Review No results found.   EKG Interpretation None      MDM   Final diagnoses:  Abdominal pain  Abdominal pain  Patient with complaints of flank pain, abdominal pain, diarrhea, dysuria and urinary frequency With history of kidney stone patient felt this was similar to that Urinalysis was not definitive, presence of mucus, leuks, bacteria Lab work largely unremarkable, slight increase in bilirubin STD testing was sent on the patient, treated with Rocephin and azithro, I have discussed that if he needs further treatment he will be called when results are in in 2-3 days. His significant other is at the bedside is aware of STD testing and the need to be treated if he has any positive  results.  Patient was stable the entire ER visit, no tachycardia, he was afebrile, clinically appear to little dry, was given IV fluids and has been up urinating several times with 1 L. I discussed supportive treatment with him and return precautions, feel he is stable for discharge at this time. He verbalized understanding of indications to return to the emergency department.    Danelle Berry, PA-C 03/27/15 1610  Rolan Bucco, MD 03/27/15 [phone]

## 2015-03-11 LAB — GC/CHLAMYDIA PROBE AMP (~~LOC~~) NOT AT ARMC
Chlamydia: NEGATIVE
NEISSERIA GONORRHEA: NEGATIVE

## 2015-03-11 LAB — HIV ANTIBODY (ROUTINE TESTING W REFLEX): HIV Screen 4th Generation wRfx: NONREACTIVE

## 2015-03-11 LAB — RPR: RPR Ser Ql: NONREACTIVE

## 2017-09-07 ENCOUNTER — Other Ambulatory Visit: Payer: Self-pay

## 2017-09-07 ENCOUNTER — Emergency Department
Admission: EM | Admit: 2017-09-07 | Discharge: 2017-09-07 | Disposition: A | Discharge status: Home / Self Care | Payer: Self-pay | Attending: Student in an Organized Health Care Education/Training Program | Admitting: Student in an Organized Health Care Education/Training Program

## 2017-09-07 ENCOUNTER — Encounter: Payer: Self-pay | Admitting: Emergency Medicine

## 2017-09-07 DIAGNOSIS — F17200 Nicotine dependence, unspecified, uncomplicated: Secondary | ICD-10-CM | POA: Insufficient documentation

## 2017-09-07 DIAGNOSIS — Z0279 Encounter for issue of other medical certificate: Secondary | ICD-10-CM | POA: Insufficient documentation

## 2017-09-07 DIAGNOSIS — Z91010 Allergy to peanuts: Secondary | ICD-10-CM | POA: Insufficient documentation

## 2017-09-07 DIAGNOSIS — Z7689 Persons encountering health services in other specified circumstances: Secondary | ICD-10-CM

## 2017-09-07 NOTE — ED Triage Notes (Signed)
Patient ambulatory to triage with steady gait, without difficulty or distress noted; pt reports his daughter was seen recently for pink eye and his employer wants him to get a note stating it's OK to return to work; pt denies any c/o at present but st was out of work 4 days for cold symptoms

## 2017-09-07 NOTE — ED Provider Notes (Signed)
[facility] Emergency Department Provider Note ____________________________________________  Time seen: 2238  I have reviewed the triage vital signs and the nursing notes.  HISTORY  Chief Complaint  Medical Clearance  HPI Austin Owen is a 27 y.o. male presents himself to the ED for request for return to work note.  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  Patient reports onset of eye irritation as well as URI symptoms in the interim.  He was apparently out of work secondary to his symptoms, for 4 days.  His employer now is requesting a note stating that he is medically cleared for return to work.  Patient denies any interim symptoms, denies any fevers, chills, sweats.  He denies any current eye drainage or irritation.  Past Medical History:  Diagnosis Date  . Kidney stones   . Nephrolithiasis     There are no active problems to display for this patient.   History reviewed. No pertinent surgical history.  Prior to Admission medications   Not on File    Allergies Peanuts [peanut oil]  No family history on file.  Social History Social History   Tobacco Use  . Smoking status: Current Every Day Smoker  . Smokeless tobacco: Never Used  Substance Use Topics  . Alcohol use: Yes    Comment: social  . Drug use: Yes    Types: Cocaine, Marijuana    Review of Systems  Constitutional: Negative for fever. Eyes: Negative for visual changes, eye drainage, matting, or tearing. ENT: Negative for sore throat. Cardiovascular: Negative for chest pain. Respiratory: Negative for shortness of breath. Gastrointestinal: Negative for abdominal pain, vomiting and diarrhea. Skin: Negative for rash. Neurological: Negative for headaches, focal weakness or numbness. ____________________________________________  PHYSICAL EXAM:  VITAL SIGNS: ED Triage Vitals [09/07/17 2155]  Enc  Vitals Group     BP 137/67     Pulse Rate 96     Resp 16     Temp 98.4 F (36.9 C)     Temp Source Oral     SpO2 96 %     Weight 130 lb (59 kg)     Height 5\' 11"  (1.803 m)     Head Circumference      Peak Flow      Pain Score      Pain Loc      Pain Edu?      Excl. in GC?     Constitutional: Alert and oriented. Well appearing and in no distress. Head: Normocephalic and atraumatic. Eyes: Conjunctivae are normal. PERRL. Normal extraocular movements Ears: Canals clear. TMs intact bilaterally. Nose: No congestion/rhinorrhea/epistaxis. Mouth/Throat: Mucous membranes are moist.  Uvula is midline and tonsils are flat.   Neck: Supple. No thyromegaly. Hematological/Lymphatic/Immunological: No cervical lymphadenopathy. Cardiovascular: Normal rate, regular rhythm. Normal distal pulses. Respiratory: Normal respiratory effort. No wheezes/rales/rhonchi. ____________________________________________  INITIAL IMPRESSION / ASSESSMENT AND PLAN / ED COURSE  Patient with ED evaluation for medical clearance and request for return to work note.  Patient has no current clinical signs or symptoms of bacterial conjunctivitis or URI.  She is discharged with a work note returning him to regular duty tomorrow as requested.  He will select and follow with the primary care provider for routine and ongoing medical care. ____________________________________________  FINAL CLINICAL IMPRESSION(S) / ED DIAGNOSES  Final diagnoses:  Return to work evaluation      Karmen Stabs, Charlesetta Ivory, PA-C 09/07/17 2315  Willy Eddyobinson, Patrick, MD 09/07/17 2322

## 2017-09-07 NOTE — Discharge Instructions (Signed)
Your exam is normal at this time. Select and follow-up with a local provider for routine medical care.

## 2017-09-12 ENCOUNTER — Emergency Department: Payer: PRIVATE HEALTH INSURANCE

## 2017-09-12 ENCOUNTER — Encounter: Payer: Self-pay | Admitting: Emergency Medicine

## 2017-09-12 ENCOUNTER — Emergency Department
Admission: EM | Admit: 2017-09-12 | Discharge: 2017-09-12 | Disposition: A | Discharge status: Home / Self Care | Payer: PRIVATE HEALTH INSURANCE | Attending: Student in an Organized Health Care Education/Training Program | Admitting: Student in an Organized Health Care Education/Training Program

## 2017-09-12 DIAGNOSIS — R300 Dysuria: Secondary | ICD-10-CM | POA: Insufficient documentation

## 2017-09-12 DIAGNOSIS — R109 Unspecified abdominal pain: Secondary | ICD-10-CM | POA: Insufficient documentation

## 2017-09-12 DIAGNOSIS — F17200 Nicotine dependence, unspecified, uncomplicated: Secondary | ICD-10-CM | POA: Insufficient documentation

## 2017-09-12 LAB — URINALYSIS, COMPLETE (UACMP) WITH MICROSCOPIC
Bacteria, UA: NONE SEEN
Bilirubin Urine: NEGATIVE
Glucose, UA: NEGATIVE mg/dL
HGB URINE DIPSTICK: NEGATIVE
Ketones, ur: NEGATIVE mg/dL
Nitrite: NEGATIVE
PROTEIN: NEGATIVE mg/dL
Specific Gravity, Urine: 1.023 (ref 1.005–1.030)
pH: 5 (ref 5.0–8.0)

## 2017-09-12 LAB — CBC
HCT: 40.9 % (ref 40.0–52.0)
Hemoglobin: 14.2 g/dL (ref 13.0–18.0)
MCH: 34.3 pg — AB (ref 26.0–34.0)
MCHC: 34.7 g/dL (ref 32.0–36.0)
MCV: 98.7 fL (ref 80.0–100.0)
Platelets: 271 10*3/uL (ref 150–440)
RBC: 4.15 MIL/uL — ABNORMAL LOW (ref 4.40–5.90)
RDW: 12 % (ref 11.5–14.5)
WBC: 6.1 10*3/uL (ref 3.8–10.6)

## 2017-09-12 LAB — BASIC METABOLIC PANEL
Anion gap: 9 (ref 5–15)
BUN: 14 mg/dL (ref 6–20)
CO2: 23 mmol/L (ref 22–32)
Calcium: 9.4 mg/dL (ref 8.9–10.3)
Chloride: 105 mmol/L (ref 101–111)
Creatinine, Ser: 1.08 mg/dL (ref 0.61–1.24)
GFR calc Af Amer: >60 mL/min (ref >60)
GLUCOSE: 104 mg/dL — AB (ref 65–99)
Potassium: 4.2 mmol/L (ref 3.5–5.1)
Sodium: 137 mmol/L (ref 135–145)

## 2017-09-12 MED ORDER — NAPROXEN 500 MG PO TABS: 500.0000 mg | ORAL_TABLET | Freq: Two times a day (BID) | ORAL | 0 refills | Status: AC

## 2017-09-12 MED ORDER — HYDROCODONE-ACETAMINOPHEN 5-325 MG PO TABS
1.0000 | ORAL_TABLET | Freq: Once | ORAL | Status: AC
  Administered 2017-09-12: 1 via ORAL
  Filled 2017-09-12: qty 1

## 2017-09-12 MED ORDER — KETOROLAC TROMETHAMINE 30 MG/ML IJ SOLN
15.0000 mg | Freq: Once | INTRAMUSCULAR | Status: AC
  Administered 2017-09-12: 15 mg via INTRAMUSCULAR
  Filled 2017-09-12: qty 1

## 2017-09-12 MED ORDER — CEFTRIAXONE SODIUM 250 MG IJ SOLR
250.0000 mg | Freq: Once | INTRAMUSCULAR | Status: AC
  Administered 2017-09-12: 250 mg via INTRAMUSCULAR
  Filled 2017-09-12: qty 250

## 2017-09-12 MED ORDER — HYDROCODONE-ACETAMINOPHEN 5-325 MG PO TABS: 1.0000 | ORAL_TABLET | ORAL | 0 refills | Status: DC | PRN

## 2017-09-12 MED ORDER — AZITHROMYCIN 500 MG PO TABS
1000.0000 mg | ORAL_TABLET | Freq: Once | ORAL | Status: AC
  Administered 2017-09-12: 1000 mg via ORAL
  Filled 2017-09-12: qty 2

## 2017-09-12 NOTE — ED Notes (Signed)

## 2017-09-12 NOTE — ED Triage Notes (Signed)
Burning with urination, has seen blood in urine.

## 2017-09-12 NOTE — ED Notes (Signed)
Patient transported to X-ray 

## 2017-09-12 NOTE — Discharge Instructions (Signed)

## 2017-09-12 NOTE — ED Triage Notes (Signed)
Pt here with c/o right flank pain has a history of kidney stones, states this pain is the same, has been vomiting as well.

## 2017-09-12 NOTE — ED Provider Notes (Signed)
[facility] Department Provider Note    None    (approximate)  I have reviewed the triage vital signs and the nursing notes.   HISTORY  Chief Complaint Flank Pain    HPI Austin [facility] is a 27 y.o. male with a history of kidney stones and ureterolithiasis as well as STD presents with 1 week of intermittent moderate right flank pain.  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  Denies any fevers.  Has noted some dysuria for the past week.  Last time he had intercourse was 1 week ago.  States he does not wear protection.  States the pain feels similar to previous episodes of kidney stones.  Is not taking anything for this.  Past Medical History:  Diagnosis Date  . Kidney stones   . Nephrolithiasis    No family history on file. History reviewed. No pertinent surgical history. There are no active problems to display for this patient.     Prior to Admission medications   Medication Sig Start Date End Date Taking? Authorizing Provider  HYDROcodone-acetaminophen (NORCO) 5-325 MG tablet Take 1 tablet by mouth every 4 (four) hours as needed for moderate pain. 09/12/17   Austin Eddy, MD  naproxen (NAPROSYN) 500 MG tablet Take 1 tablet (500 mg total) by mouth 2 (two) times daily with a meal. 09/12/17 09/12/18  Austin Eddy, MD    Allergies Peanuts [peanut oil]    Social History Social History   Tobacco Use  . Smoking status: Current Every Day Smoker  . Smokeless tobacco: Never Used  Substance Use Topics  . Alcohol use: Yes    Comment: social  . Drug use: Yes    Types: Cocaine, Marijuana    Review of Systems Patient denies headaches, rhinorrhea, blurry vision, numbness, shortness of breath, chest pain, edema, cough, abdominal pain, nausea, vomiting, diarrhea, dysuria, fevers, rashes or hallucinations unless otherwise stated above in HPI. ____________________________________________   PHYSICAL  EXAM:  VITAL SIGNS: Vitals:   09/12/17 0956 09/12/17 1138  BP: (!) 136/51 (!) 158/84  Pulse: 76 84  Resp: 18 19  Temp: 97.9 F (36.6 C)   SpO2: 98% 97%    Constitutional: Alert and oriented. Well appearing and in no acute distress. Eyes: Conjunctivae are normal.  Head: Atraumatic. Nose: No congestion/rhinnorhea. Mouth/Throat: Mucous membranes are moist.   Neck: No stridor. Painless ROM.  Cardiovascular: Normal rate, regular rhythm. Grossly normal heart sounds.  Good peripheral circulation. Respiratory: Normal respiratory effort.  No retractions. Lungs CTAB. Gastrointestinal: Soft and nontender. No distention. No abdominal bruits. No CVA tenderness. Genitourinary:  Musculoskeletal: No lower extremity tenderness nor edema.  No joint effusions. Neurologic:  Normal speech and language. No gross focal neurologic deficits are appreciated. No facial droop Skin:  Skin is warm, dry and intact. No rash noted. Psychiatric: Mood and affect are normal. Speech and behavior are normal.  ____________________________________________   LABS (all labs ordered are listed, but only abnormal results are displayed)  Results for orders placed or performed during the hospital encounter of 09/12/17 (from the past 24 hour(s))  Urinalysis, Complete w Microscopic     Status: Abnormal   Collection Time: 09/12/17 10:03 AM  Result Value Ref Range   Color, Urine YELLOW (A) YELLOW   APPearance HAZY (A) CLEAR   Specific Gravity, Urine 1.023 1.005 - 1.030   pH 5.0 5.0 - 8.0   Glucose, UA NEGATIVE NEGATIVE mg/dL   Hgb urine dipstick NEGATIVE NEGATIVE   Bilirubin Urine NEGATIVE  NEGATIVE   Ketones, ur NEGATIVE NEGATIVE mg/dL   Protein, ur NEGATIVE NEGATIVE mg/dL   Nitrite NEGATIVE NEGATIVE   Leukocytes, UA TRACE (A) NEGATIVE   RBC / HPF 0-5 0 - 5 RBC/hpf   WBC, UA 0-5 0 - 5 WBC/hpf   Bacteria, UA NONE SEEN NONE SEEN   Squamous Epithelial / LPF 0-5 (A) NONE SEEN   Mucus PRESENT   Basic metabolic panel      Status: Abnormal   Collection Time: 09/12/17 10:03 AM  Result Value Ref Range   Sodium 137 135 - 145 mmol/L   Potassium 4.2 3.5 - 5.1 mmol/L   Chloride 105 101 - 111 mmol/L   CO2 23 22 - 32 mmol/L   Glucose, Bld 104 (H) 65 - 99 mg/dL   BUN 14 6 - 20 mg/dL   Creatinine, Ser 1.611.08 0.61 - 1.24 mg/dL   Calcium 9.4 8.9 - 09.610.3 mg/dL   GFR calc non Af Amer >60 >60 mL/min   GFR calc Af Amer >60 >60 mL/min   Anion gap 9 5 - 15  CBC     Status: Abnormal   Collection Time: 09/12/17 10:03 AM  Result Value Ref Range   WBC 6.1 3.8 - 10.6 K/uL   RBC 4.15 (L) 4.40 - 5.90 MIL/uL   Hemoglobin 14.2 13.0 - 18.0 g/dL   HCT 04.540.9 40.940.0 - 81.152.0 %   MCV 98.7 80.0 - 100.0 fL   MCH 34.3 (H) 26.0 - 34.0 pg   MCHC 34.7 32.0 - 36.0 g/dL   RDW 91.412.0 78.211.5 - 95.614.5 %   Platelets 271 150 - 440 K/uL   ____________________________________________ _______________________________  RADIOLOGY  I personally reviewed all radiographic images ordered to evaluate for the above acute complaints and reviewed radiology reports and findings.  These findings were personally discussed with the patient.  Please see medical record for radiology report.  ____________________________________________   PROCEDURES  Procedure(s) performed:  Procedures    Critical Care performed: no ____________________________________________   INITIAL IMPRESSION / ASSESSMENT AND PLAN / ED COURSE  Pertinent labs & imaging results that were available during my care of the patient were reviewed by me and considered in my medical decision making (see chart for details).  DDX: ureterolithiasis, std, pyelo, appy, colittis, msk strain  Austin [facility] is a 27 y.o. who presents to the ED with right flank pain as described above as well as dysuria.  Blood work is reassuring.  He is afebrile and hemodynamically stable.  His abdominal exam is soft and benign.  X-ray shows no evidence of large stone free air or obstructive process.  Urine does  show sterile pyuria no hematuria he is sexually active with a history of STI will treat for STD and have recommended follow-up with Austin Owen.  Possible component of ureterolithiasis given his history but most likely consistent with muscular skeletal strain.  Patient stable and appropriate for follow-up with outpatient providers.  Have discussed with the patient and available family all diagnostics and treatments performed thus far and all questions were answered to the best of my ability. The patient demonstrates understanding and agreement with plan.       ____________________________________________   FINAL CLINICAL IMPRESSION(S) / ED DIAGNOSES  Final diagnoses:  Right flank pain  Dysuria      NEW MEDICATIONS STARTED DURING THIS VISIT:  Discharge Medication List as of 09/12/2017 11:16 AM    START taking these medications   Details  HYDROcodone-acetaminophen (NORCO) 5-325 MG tablet Take  1 tablet by mouth every 4 (four) hours as needed for moderate pain., Starting Tue 09/12/2017, Print    naproxen (NAPROSYN) 500 MG tablet Take 1 tablet (500 mg total) by mouth 2 (two) times daily with a meal., Starting Tue 09/12/2017, Until Wed 09/12/2018, Print         Note:  This document was prepared using Dragon voice recognition software and may include unintentional dictation errors.    Austin Eddy, MD 09/12/17 [phone]

## 2018-02-06 ENCOUNTER — Emergency Department: Payer: Self-pay

## 2018-02-06 ENCOUNTER — Encounter: Payer: Self-pay | Admitting: Emergency Medicine

## 2018-02-06 ENCOUNTER — Emergency Department
Admission: EM | Admit: 2018-02-06 | Discharge: 2018-02-06 | Disposition: A | Discharge status: Home / Self Care | Payer: Self-pay | Attending: Emergency Medicine | Admitting: Emergency Medicine

## 2018-02-06 DIAGNOSIS — F17210 Nicotine dependence, cigarettes, uncomplicated: Secondary | ICD-10-CM | POA: Insufficient documentation

## 2018-02-06 DIAGNOSIS — Y939 Activity, unspecified: Secondary | ICD-10-CM | POA: Insufficient documentation

## 2018-02-06 DIAGNOSIS — S82141A Displaced bicondylar fracture of right tibia, initial encounter for closed fracture: Secondary | ICD-10-CM | POA: Insufficient documentation

## 2018-02-06 DIAGNOSIS — Z91010 Allergy to peanuts: Secondary | ICD-10-CM | POA: Insufficient documentation

## 2018-02-06 DIAGNOSIS — Y999 Unspecified external cause status: Secondary | ICD-10-CM | POA: Insufficient documentation

## 2018-02-06 DIAGNOSIS — S0990XA Unspecified injury of head, initial encounter: Secondary | ICD-10-CM | POA: Insufficient documentation

## 2018-02-06 DIAGNOSIS — Y92410 Unspecified street and highway as the place of occurrence of the external cause: Secondary | ICD-10-CM | POA: Insufficient documentation

## 2018-02-06 DIAGNOSIS — M25532 Pain in left wrist: Secondary | ICD-10-CM | POA: Insufficient documentation

## 2018-02-06 LAB — CBC
HCT: 40.2 % (ref 40.0–52.0)
Hemoglobin: 14.1 g/dL (ref 13.0–18.0)
MCH: 34.6 pg — ABNORMAL HIGH (ref 26.0–34.0)
MCHC: 35 g/dL (ref 32.0–36.0)
MCV: 98.9 fL (ref 80.0–100.0)
PLATELETS: 291 10*3/uL (ref 150–440)
RBC: 4.06 MIL/uL — AB (ref 4.40–5.90)
RDW: 12.2 % (ref 11.5–14.5)
WBC: 14.6 10*3/uL — ABNORMAL HIGH (ref 3.8–10.6)

## 2018-02-06 LAB — COMPREHENSIVE METABOLIC PANEL
ALT: 21 U/L (ref 0–44)
AST: 50 U/L — ABNORMAL HIGH (ref 15–41)
Albumin: 4.6 g/dL (ref 3.5–5.0)
Alkaline Phosphatase: 72 U/L (ref 38–126)
Anion gap: 13 (ref 5–15)
BUN: 8 mg/dL (ref 6–20)
CHLORIDE: 108 mmol/L (ref 98–111)
CO2: 20 mmol/L — AB (ref 22–32)
Calcium: 9 mg/dL (ref 8.9–10.3)
Creatinine, Ser: 0.91 mg/dL (ref 0.61–1.24)
GFR calc Af Amer: >60 mL/min (ref >60)
GFR calc non Af Amer: >60 mL/min (ref >60)
Glucose, Bld: 99 mg/dL (ref 70–99)
POTASSIUM: 3.8 mmol/L (ref 3.5–5.1)
SODIUM: 141 mmol/L (ref 135–145)
Total Bilirubin: 1.5 mg/dL — ABNORMAL HIGH (ref 0.3–1.2)
Total Protein: 7.5 g/dL (ref 6.5–8.1)

## 2018-02-06 LAB — URINE DRUG SCREEN, QUALITATIVE (ARMC ONLY)
AMPHETAMINES, UR SCREEN: NOT DETECTED
Benzodiazepine, Ur Scrn: NOT DETECTED
COCAINE METABOLITE, UR ~~LOC~~: POSITIVE — AB
Cannabinoid 50 Ng, Ur ~~LOC~~: POSITIVE — AB
MDMA (Ecstasy)Ur Screen: NOT DETECTED
METHADONE SCREEN, URINE: NOT DETECTED
Opiate, Ur Screen: NOT DETECTED
Phencyclidine (PCP) Ur S: NOT DETECTED
Tricyclic, Ur Screen: NOT DETECTED

## 2018-02-06 LAB — ETHANOL: Alcohol, Ethyl (B): 147 mg/dL — ABNORMAL HIGH (ref <10)

## 2018-02-06 MED ORDER — IBUPROFEN 600 MG PO TABS: 600.0000 mg | ORAL_TABLET | Freq: Four times a day (QID) | ORAL | 0 refills | Status: DC | PRN

## 2018-02-06 MED ORDER — CYCLOBENZAPRINE HCL 5 MG PO TABS: ORAL_TABLET | ORAL | 0 refills | Status: DC

## 2018-02-06 MED ORDER — TRAMADOL HCL 50 MG PO TABS: 50.0000 mg | ORAL_TABLET | Freq: Four times a day (QID) | ORAL | 0 refills | Status: AC | PRN
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
## 2018-02-06 NOTE — Discharge Instructions (Signed)
No weightbearing to right leg until seen by orthopedics. Please keep brace on and use crutches at all times.

## 2018-02-13 ENCOUNTER — Telehealth: Payer: Self-pay
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
## 2020-02-20 HISTORY — PX: KNEE CARTILAGE SURGERY: SHX688

## 2020-02-27 ENCOUNTER — Emergency Department (HOSPITAL_COMMUNITY)
Admission: EM | Admit: 2020-02-27 | Discharge: 2020-02-28 | Disposition: A | Discharge status: Other Acute Inpatient Hospital | Payer: Self-pay | Attending: Emergency Medicine | Admitting: Emergency Medicine

## 2020-02-27 ENCOUNTER — Encounter (HOSPITAL_COMMUNITY): Payer: Self-pay | Admitting: Emergency Medicine

## 2020-02-27 ENCOUNTER — Other Ambulatory Visit: Payer: Self-pay

## 2020-02-27 ENCOUNTER — Emergency Department (HOSPITAL_COMMUNITY): Payer: Self-pay

## 2020-02-27 DIAGNOSIS — R238 Other skin changes: Secondary | ICD-10-CM | POA: Insufficient documentation

## 2020-02-27 DIAGNOSIS — R079 Chest pain, unspecified: Secondary | ICD-10-CM | POA: Insufficient documentation

## 2020-02-27 DIAGNOSIS — Z91010 Allergy to peanuts: Secondary | ICD-10-CM | POA: Insufficient documentation

## 2020-02-27 DIAGNOSIS — R1110 Vomiting, unspecified: Secondary | ICD-10-CM | POA: Insufficient documentation

## 2020-02-27 DIAGNOSIS — F17200 Nicotine dependence, unspecified, uncomplicated: Secondary | ICD-10-CM | POA: Insufficient documentation

## 2020-02-27 DIAGNOSIS — R0602 Shortness of breath: Secondary | ICD-10-CM | POA: Insufficient documentation

## 2020-02-27 DIAGNOSIS — L03115 Cellulitis of right lower limb: Secondary | ICD-10-CM | POA: Insufficient documentation

## 2020-02-27 NOTE — ED Triage Notes (Addendum)
Pt had right knee surgery at [facility] on 02/20/2020. Pt reports shortness of breath today starting around 12pm. Redness and warmth noted to right knee area.

## 2020-02-28 ENCOUNTER — Emergency Department (HOSPITAL_COMMUNITY): Payer: Self-pay

## 2020-02-28 LAB — CBC WITH DIFFERENTIAL/PLATELET
Abs Immature Granulocytes: 0.06 10*3/uL (ref 0.00–0.07)
Basophils Absolute: 0.1 10*3/uL (ref 0.0–0.1)
Basophils Relative: 0 %
Eosinophils Absolute: 0.2 10*3/uL (ref 0.0–0.5)
Eosinophils Relative: 1 %
HCT: 35.5 % — ABNORMAL LOW (ref 39.0–52.0)
Hemoglobin: 11.6 g/dL — ABNORMAL LOW (ref 13.0–17.0)
Immature Granulocytes: 1 %
Lymphocytes Relative: 15 %
Lymphs Abs: 1.7 10*3/uL (ref 0.7–4.0)
MCH: 33.1 pg (ref 26.0–34.0)
MCHC: 32.7 g/dL (ref 30.0–36.0)
MCV: 101.4 fL — ABNORMAL HIGH (ref 80.0–100.0)
Monocytes Absolute: 1.1 10*3/uL — ABNORMAL HIGH (ref 0.1–1.0)
Monocytes Relative: 9 %
Neutro Abs: 8.6 10*3/uL — ABNORMAL HIGH (ref 1.7–7.7)
Neutrophils Relative %: 74 %
Platelets: 343 10*3/uL (ref 150–400)
RBC: 3.5 MIL/uL — ABNORMAL LOW (ref 4.22–5.81)
RDW: 11.4 % — ABNORMAL LOW (ref 11.5–15.5)
WBC: 11.6 10*3/uL — ABNORMAL HIGH (ref 4.0–10.5)
nRBC: 0 % (ref 0.0–0.2)

## 2020-02-28 LAB — BASIC METABOLIC PANEL
Anion gap: 10 (ref 5–15)
BUN: 12 mg/dL (ref 6–20)
CO2: 24 mmol/L (ref 22–32)
Calcium: 9.2 mg/dL (ref 8.9–10.3)
Chloride: 102 mmol/L (ref 98–111)
Creatinine, Ser: 0.95 mg/dL (ref 0.61–1.24)
GFR calc Af Amer: >60 mL/min (ref >60)
GFR calc non Af Amer: >60 mL/min (ref >60)
Glucose, Bld: 131 mg/dL — ABNORMAL HIGH (ref 70–99)
Potassium: 3.6 mmol/L (ref 3.5–5.1)
Sodium: 136 mmol/L (ref 135–145)

## 2020-02-28 LAB — SEDIMENTATION RATE: Sed Rate: 60 mm/hr — ABNORMAL HIGH (ref 0–16)

## 2020-02-28 MED ORDER — IOHEXOL 350 MG/ML SOLN
100.0000 mL | Freq: Once | INTRAVENOUS | Status: AC | PRN
  Administered 2020-02-28: 100 mL via INTRAVENOUS

## 2020-02-28 MED ORDER — FENTANYL CITRATE (PF) 100 MCG/2ML IJ SOLN
100.0000 ug | INTRAMUSCULAR | Status: DC | PRN
  Administered 2020-02-28: 100 ug via INTRAVENOUS
  Filled 2020-02-28: qty 2

## 2020-02-28 MED ORDER — VANCOMYCIN HCL IN DEXTROSE 1-5 GM/200ML-% IV SOLN
1000.0000 mg | Freq: Once | INTRAVENOUS | Status: AC
  Administered 2020-02-28: 1000 mg via INTRAVENOUS
  Filled 2020-02-28: qty 200

## 2020-02-28 MED ORDER — FENTANYL CITRATE (PF) 100 MCG/2ML IJ SOLN
100.0000 ug | Freq: Once | INTRAMUSCULAR | Status: AC
  Administered 2020-02-28: 100 ug via INTRAVENOUS
  Filled 2020-02-28: qty 2

## 2020-02-28 NOTE — ED Provider Notes (Signed)
[facility] EMERGENCY DEPARTMENT Provider Note   CSN: [id number] Arrival date & time: 02/27/20  2318     History Chief Complaint  Patient presents with  . Shortness of Breath    Austin Owen Krall is a 29 y.o. male.  The history is provided by the patient.  Shortness of Breath Severity:  Moderate Onset quality:  Sudden Timing:  Intermittent Progression:  Worsening Relieved by:  Nothing Worsened by:  Deep breathing Associated symptoms: chest pain and vomiting   Associated symptoms: no fever   Patient with history of kidney stones presents with right leg swelling and shortness of breath.  Patient underwent right knee arthroscopy on July 15 without any complications.  He was seen as an outpatient on July 19 without any issues.  Over the past 24 hours patient has had increasing right leg pain and swelling.  He also reports redness.  He reports over the past day has had increasing shortness of breath and mild chest pain with breathing.  No fevers but he does report vomiting No previous history of DVT.  He reports taking aspirin daily     Past Medical History:  Diagnosis Date  . Kidney stones   . Nephrolithiasis     There are no problems to display for this patient.   Past Surgical History:  Procedure Laterality Date  . KNEE CARTILAGE SURGERY Right 02/20/2020       No family history on file.  Social History   Tobacco Use  . Smoking status: Current Every Day Smoker  . Smokeless tobacco: Never Used  Substance Use Topics  . Alcohol use: Yes    Comment: social  . Drug use: Yes    Types: Cocaine, Marijuana    Home Medications Prior to Admission medications   Medication Sig Start Date End Date Taking? Authorizing Provider  cyclobenzaprine (FLEXERIL) 5 MG tablet Take 1-2 tablets 3 times daily as needed 02/06/18   Enid Derry, PA-C  HYDROcodone-acetaminophen (NORCO) 5-325 MG tablet Take 1 tablet by mouth every 4 (four) hours as needed for moderate pain. 09/12/17    Willy Eddy, MD  ibuprofen (ADVIL,MOTRIN) 600 MG tablet Take 1 tablet (600 mg total) by mouth every 6 (six) hours as needed. 02/06/18   Enid Derry, PA-C    Allergies    Peanuts [peanut oil] and Tylenol [acetaminophen]  Review of Systems   Review of Systems  Constitutional: Negative for fever.  Respiratory: Positive for shortness of breath.   Cardiovascular: Positive for chest pain and leg swelling.  Gastrointestinal: Positive for vomiting.  Musculoskeletal: Positive for arthralgias and joint swelling.  Skin: Positive for color change.  All other systems reviewed and are negative.   Physical Exam Updated Vital Signs BP (!) 136/78   Pulse 87   Temp 98.1 F (36.7 C) (Oral)   Resp 16   Ht 1.803 m (5\' 11" )   Wt 65.8 kg   SpO2 98%   BMI 20.22 kg/m   Physical Exam CONSTITUTIONAL: Well developed/well nourished HEAD: Normocephalic/atraumatic EYES: EOMI/PERRL ENMT: Mucous membranes moist NECK: supple no meningeal signs SPINE/BACK:entire spine nontender CV: S1/S2 noted, no murmurs/rubs/gallops noted LUNGS: Lungs are clear to auscultation bilaterally, no apparent distress ABDOMEN: soft, nontender, no rebound or guarding, bowel sounds noted throughout abdomen GU:no cva tenderness NEURO: Pt is awake/alert/appropriate, moves all extremitiesx4.  No facial droop.  Patient is able to move toes on both feet EXTREMITIES: pulses normal/equal, full ROM, distal pulses intact. Diffuse tenderness to right knee and right calf.  Diffuse erythema and  swelling noted.  See photo SKIN: warm, color normal PSYCH: no abnormalities of mood noted, alert and oriented to situation     ED Results / Procedures / Treatments   Labs (all labs ordered are listed, but only abnormal results are displayed) Labs Reviewed  BASIC METABOLIC PANEL - Abnormal; Notable for the following components:      Result Value   Glucose, Bld 131 (*)    All other components within normal limits  CBC WITH  DIFFERENTIAL/PLATELET - Abnormal; Notable for the following components:   WBC 11.6 (*)    RBC 3.50 (*)    Hemoglobin 11.6 (*)    HCT 35.5 (*)    MCV 101.4 (*)    RDW 11.4 (*)    Neutro Abs 8.6 (*)    Monocytes Absolute 1.1 (*)    All other components within normal limits  CULTURE, BLOOD (ROUTINE X 2)  CULTURE, BLOOD (ROUTINE X 2)  SEDIMENTATION RATE    EKG EKG Interpretation  Date/Time:  Thursday February 27 2020 23:42:47 EDT Ventricular Rate:  78 PR Interval:    QRS Duration: 81 QT Interval:  358 QTC Calculation: 408 R Axis:   75 Text Interpretation: Sinus rhythm Interpretation limited secondary to artifact No previous ECGs available Confirmed by Zadie Rhine ([id number]) on 02/28/2020 12:19:23 AM   Radiology CT Angio Chest PE W and/or Wo Contrast  Result Date: 02/28/2020 CLINICAL DATA:  Recent knee surgery, short of breath, chest pain EXAM: CT ANGIOGRAPHY CHEST WITH CONTRAST TECHNIQUE: Multidetector CT imaging of the chest was performed using the standard protocol during bolus administration of intravenous contrast. Multiplanar CT image reconstructions and MIPs were obtained to evaluate the vascular anatomy. CONTRAST:  OMNIPAQUE IOHEXOL 350 MG/ML SOLN COMPARISON:  02/27/2020 FINDINGS: Cardiovascular: This is a technically adequate evaluation of the pulmonary vasculature. No filling defects or pulmonary emboli. The heart is unremarkable without pericardial effusion. No evidence of thoracic aortic aneurysm or dissection. Mediastinum/Nodes: No enlarged mediastinal, hilar, or axillary lymph nodes. Thyroid gland, trachea, and esophagus demonstrate no significant findings. Lungs/Pleura: No airspace disease, effusion, or pneumothorax. Central airways are patent. Upper Abdomen: No acute abnormality. Musculoskeletal: No acute or destructive bony lesions. Reconstructed images demonstrate no additional findings. Review of the MIP images confirms the above findings. IMPRESSION: 1. No evidence of  pulmonary embolus. 2. No acute intrathoracic process. Electronically Signed   By: Sharlet Salina M.D.   On: 02/28/2020 01:46   DG Chest Port 1 View  Result Date: 02/28/2020 CLINICAL DATA:  Short of breath, tobacco abuse, recent orthopedic surgery EXAM: PORTABLE CHEST 1 VIEW COMPARISON:  None. FINDINGS: The heart size and mediastinal contours are within normal limits. Both lungs are clear. The visualized skeletal structures are unremarkable. IMPRESSION: No active disease. Electronically Signed   By: Sharlet Salina M.D.   On: 02/28/2020 00:18    Procedures Procedures  Medications Ordered in ED Medications  fentaNYL (SUBLIMAZE) injection 100 mcg (100 mcg Intravenous Given 02/28/20 0038)  iohexol (OMNIPAQUE) 350 MG/ML injection 100 mL (100 mLs Intravenous Contrast Given 02/28/20 0124)    ED Course  I have reviewed the triage vital signs and the nursing notes.  Pertinent labs & imaging results that were available during my care of the patient were reviewed by me and considered in my medical decision making (see chart for details).    MDM Rules/Calculators/A&P                         12:28  AM Patient with right knee surgery recently, now with right leg pain and swelling.  Favor cellulitis/postop infection, but could be DVT.  Patient also reporting shortness of breath and chest pain.  Due to recent surgery and chest symptoms, will proceed with CT angio chest.  After that I will place a call to his orthopedist at [facility] 2:05 AM CT chest negative.  Awaiting callback from his orthopedist .2:25 AM Discussed case with Dr. Roney Mans.  Plan to start vancomycin.  Send blood cultures and sed rate.  He accepts in ER to ER transfer to [facility]    This patient presents to the ED for concern of shortness of breath and right leg pain, this involves an extensive number of treatment options, and is a complaint that carries with it a high risk of complications and morbidity.  The differential diagnosis  includes pulmonary embolism, pneumonia, DVT in the leg, cellulitis   Lab Tests:   I Ordered, reviewed, and interpreted labs, which included complete blood count, metabolic panel  Medicines ordered:   I ordered medication fentanyl for pain  Imaging Studies ordered:   I ordered imaging studies which included chest x-ray and CT chest  I independently visualized and interpreted imaging which showed no acute findings  Additional history obtained:   Previous records obtained and reviewed patient underwent right knee arthroscopy by Dr. Roney Mans at [facility]  Consultations Obtained:   I consulted Dr. Roney Mans and discussed lab and imaging findings  Dr. Roney Mans accepts in transfer  Reevaluation:  After the interventions stated above, I reevaluated the patient and found patient is stable  Final Clinical Impression(s) / ED Diagnoses Final diagnoses:  Cellulitis of right leg    Rx / DC Orders ED Discharge Orders    None       Zadie Rhine, MD 02/28/20 [phone]

## 2020-02-28 NOTE — ED Provider Notes (Signed)
BP (!) 117/60   Pulse 64   Temp 98.1 F (36.7 C) (Oral)   Resp 17   Ht 1.803 m (5\' 11" )   Wt 65.8 kg   SpO2 97%   BMI 20.22 kg/m   The patient appears reasonably stabilized for transfer considering the current resources, flow, and capabilities available in the ED at this time, and I doubt any other [facility] requiring further screening and/or treatment in the ED prior to transfer.    [facility], MD 02/28/20 [phone]

## 2020-03-04 LAB — CULTURE, BLOOD (ROUTINE X 2)
Culture: NO GROWTH
Culture: NO GROWTH
Special Requests: ADEQUATE
Special Requests: ADEQUATE

## 2020-04-28 ENCOUNTER — Other Ambulatory Visit: Payer: Self-pay

## 2020-04-28 ENCOUNTER — Emergency Department (HOSPITAL_COMMUNITY)
Admission: EM | Admit: 2020-04-28 | Discharge: 2020-04-29 | Disposition: A | Discharge status: Home / Self Care | Payer: BLUE CROSS/BLUE SHIELD | Attending: Emergency Medicine | Admitting: Emergency Medicine

## 2020-04-28 ENCOUNTER — Emergency Department (HOSPITAL_COMMUNITY): Payer: BLUE CROSS/BLUE SHIELD

## 2020-04-28 ENCOUNTER — Encounter (HOSPITAL_COMMUNITY): Payer: Self-pay | Admitting: Emergency Medicine

## 2020-04-28 DIAGNOSIS — M25461 Effusion, right knee: Secondary | ICD-10-CM | POA: Diagnosis not present

## 2020-04-28 DIAGNOSIS — M25561 Pain in right knee: Secondary | ICD-10-CM | POA: Diagnosis present

## 2020-04-28 DIAGNOSIS — Y92410 Unspecified street and highway as the place of occurrence of the external cause: Secondary | ICD-10-CM | POA: Insufficient documentation

## 2020-04-28 DIAGNOSIS — Z91010 Allergy to peanuts: Secondary | ICD-10-CM | POA: Diagnosis not present

## 2020-04-28 DIAGNOSIS — F17210 Nicotine dependence, cigarettes, uncomplicated: Secondary | ICD-10-CM | POA: Diagnosis not present

## 2020-04-28 DIAGNOSIS — S8001XA Contusion of right knee, initial encounter: Secondary | ICD-10-CM
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
## 2020-04-29 NOTE — Discharge Instructions (Addendum)
Take ibuprofen 600 mg every 6 hours as needed for pain.  Wear your knee brace for the next several days, then slowly reintroduce weightbearing as tolerated.  You should follow-up with your orthopedic surgeon in the next 1 to 2 weeks if not improving.

## 2020-08-17 ENCOUNTER — Encounter (HOSPITAL_COMMUNITY): Payer: Self-pay | Admitting: Emergency Medicine

## 2020-08-17 ENCOUNTER — Emergency Department (HOSPITAL_COMMUNITY)
Admission: EM | Admit: 2020-08-17 | Discharge: 2020-08-18 | Disposition: A | Discharge status: Home / Self Care | Payer: BLUE CROSS/BLUE SHIELD | Attending: Emergency Medicine | Admitting: Emergency Medicine

## 2020-08-17 ENCOUNTER — Other Ambulatory Visit: Payer: Self-pay

## 2020-08-17 DIAGNOSIS — W57XXXA Bitten or stung by nonvenomous insect and other nonvenomous arthropods, initial encounter: Secondary | ICD-10-CM | POA: Diagnosis not present

## 2020-08-17 DIAGNOSIS — S30861A Insect bite (nonvenomous) of abdominal wall, initial encounter: Secondary | ICD-10-CM | POA: Diagnosis present

## 2020-08-17 DIAGNOSIS — Z5321 Procedure and treatment not carried out due to patient leaving prior to being seen by health care provider: Secondary | ICD-10-CM | POA: Insufficient documentation

## 2020-08-17 HISTORY — DX: Unspecified glaucoma: H40.9

## 2020-08-17 HISTORY — DX: Bipolar disorder, unspecified: F31.9

## 2020-08-17 HISTORY — DX: Depression, unspecified: F32.A

## 2020-08-17 NOTE — ED Triage Notes (Signed)
Pt states he was bitten by a spider a week ago. There is a black spot on his abdomen.

## 2020-10-07 IMAGING — DX DG CHEST 1V PORT
1 series · 1 of 1 positions shown · non-contrast
Comparison: None.

CLINICAL DATA: Short of breath, tobacco abuse, recent orthopedic
surgery

EXAM:
PORTABLE CHEST 1 VIEW

[chest ap]
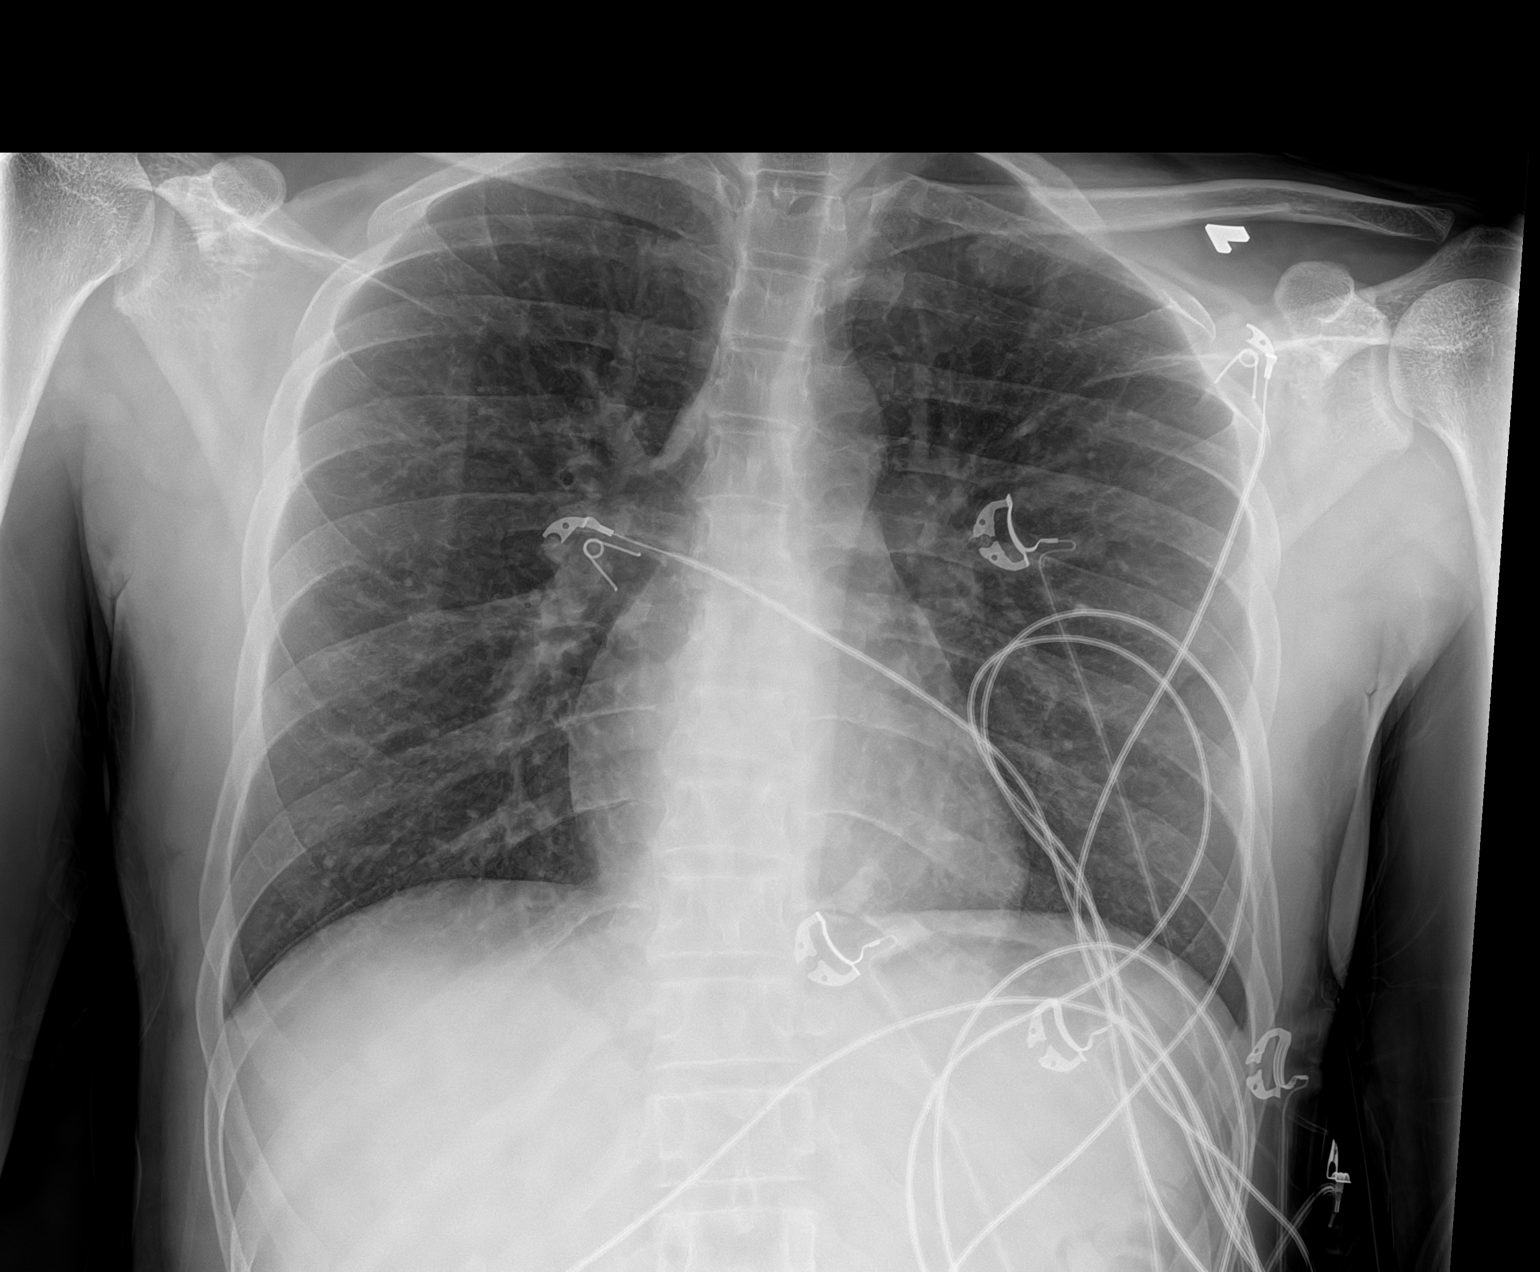

[1 of 1 positions shown; findings below may reference images not displayed]

FINDINGS: The heart size and mediastinal contours are within normal limits.
Both lungs are clear. The visualized skeletal structures are
unremarkable.
IMPRESSION: No active disease.

## 2022-07-16 ENCOUNTER — Encounter: Payer: Self-pay | Admitting: Emergency Medicine

## 2022-07-16 ENCOUNTER — Emergency Department
Admission: EM | Admit: 2022-07-16 | Discharge: 2022-07-16 | Disposition: A | Discharge status: Other Acute Inpatient Hospital | Payer: Federal, State, Local not specified - Other | Attending: Emergency Medicine | Admitting: Emergency Medicine

## 2022-07-16 ENCOUNTER — Other Ambulatory Visit: Payer: Self-pay

## 2022-07-16 ENCOUNTER — Encounter (HOSPITAL_COMMUNITY): Payer: Self-pay | Admitting: Psychiatry

## 2022-07-16 ENCOUNTER — Inpatient Hospital Stay (HOSPITAL_COMMUNITY)
Admission: AD | Admit: 2022-07-16 | Discharge: 2022-07-23 | DRG: 885 | Disposition: A | Discharge status: Home / Self Care | Payer: Federal, State, Local not specified - Other | Source: Intra-hospital | Attending: Psychiatry | Admitting: Psychiatry

## 2022-07-16 DIAGNOSIS — Z5941 Food insecurity: Secondary | ICD-10-CM | POA: Diagnosis not present

## 2022-07-16 DIAGNOSIS — F4310 Post-traumatic stress disorder, unspecified: Secondary | ICD-10-CM | POA: Diagnosis present

## 2022-07-16 DIAGNOSIS — F1490 Cocaine use, unspecified, uncomplicated: Secondary | ICD-10-CM | POA: Insufficient documentation

## 2022-07-16 DIAGNOSIS — R45851 Suicidal ideations: Secondary | ICD-10-CM | POA: Insufficient documentation

## 2022-07-16 DIAGNOSIS — F314 Bipolar disorder, current episode depressed, severe, without psychotic features: Secondary | ICD-10-CM | POA: Diagnosis present

## 2022-07-16 DIAGNOSIS — F1410 Cocaine abuse, uncomplicated: Secondary | ICD-10-CM | POA: Diagnosis present

## 2022-07-16 DIAGNOSIS — F609 Personality disorder, unspecified: Secondary | ICD-10-CM | POA: Insufficient documentation

## 2022-07-16 DIAGNOSIS — Z1152 Encounter for screening for COVID-19: Secondary | ICD-10-CM

## 2022-07-16 DIAGNOSIS — F1090 Alcohol use, unspecified, uncomplicated: Secondary | ICD-10-CM | POA: Insufficient documentation

## 2022-07-16 DIAGNOSIS — F6089 Other specific personality disorders: Secondary | ICD-10-CM | POA: Diagnosis present

## 2022-07-16 DIAGNOSIS — R259 Unspecified abnormal involuntary movements: Secondary | ICD-10-CM | POA: Insufficient documentation

## 2022-07-16 DIAGNOSIS — F1010 Alcohol abuse, uncomplicated: Secondary | ICD-10-CM | POA: Diagnosis present

## 2022-07-16 DIAGNOSIS — F332 Major depressive disorder, recurrent severe without psychotic features: Principal | ICD-10-CM | POA: Diagnosis present

## 2022-07-16 DIAGNOSIS — H409 Unspecified glaucoma: Secondary | ICD-10-CM | POA: Diagnosis present

## 2022-07-16 DIAGNOSIS — Z818 Family history of other mental and behavioral disorders: Secondary | ICD-10-CM | POA: Diagnosis not present

## 2022-07-16 DIAGNOSIS — Z1339 Encounter for screening examination for other mental health and behavioral disorders: Secondary | ICD-10-CM | POA: Insufficient documentation

## 2022-07-16 DIAGNOSIS — F1910 Other psychoactive substance abuse, uncomplicated: Secondary | ICD-10-CM | POA: Insufficient documentation

## 2022-07-16 DIAGNOSIS — F17210 Nicotine dependence, cigarettes, uncomplicated: Secondary | ICD-10-CM | POA: Diagnosis present

## 2022-07-16 LAB — ACETAMINOPHEN LEVEL: Acetaminophen (Tylenol), Serum: <10 ug/mL — ABNORMAL LOW (ref 10–30)

## 2022-07-16 LAB — CBC
HCT: 45.6 % (ref 39.0–52.0)
Hemoglobin: 15.2 g/dL (ref 13.0–17.0)
MCH: 33.3 pg (ref 26.0–34.0)
MCHC: 33.3 g/dL (ref 30.0–36.0)
MCV: 99.8 fL (ref 80.0–100.0)
Platelets: 360 10*3/uL (ref 150–400)
RBC: 4.57 MIL/uL (ref 4.22–5.81)
RDW: 12.2 % (ref 11.5–15.5)
WBC: 8.1 10*3/uL (ref 4.0–10.5)
nRBC: 0 % (ref 0.0–0.2)

## 2022-07-16 LAB — URINE DRUG SCREEN, QUALITATIVE (ARMC ONLY)
Amphetamines, Ur Screen: NOT DETECTED
Barbiturates, Ur Screen: NOT DETECTED
Benzodiazepine, Ur Scrn: NOT DETECTED
Cannabinoid 50 Ng, Ur ~~LOC~~: POSITIVE — AB
Cocaine Metabolite,Ur ~~LOC~~: POSITIVE — AB
MDMA (Ecstasy)Ur Screen: NOT DETECTED
Methadone Scn, Ur: NOT DETECTED
Opiate, Ur Screen: NOT DETECTED
Phencyclidine (PCP) Ur S: NOT DETECTED
Tricyclic, Ur Screen: NOT DETECTED

## 2022-07-16 LAB — COMPREHENSIVE METABOLIC PANEL
ALT: 18 U/L (ref 0–44)
AST: 19 U/L (ref 15–41)
Albumin: 4.5 g/dL (ref 3.5–5.0)
Alkaline Phosphatase: 92 U/L (ref 38–126)
Anion gap: 9 (ref 5–15)
BUN: <5 mg/dL — ABNORMAL LOW (ref 6–20)
CO2: 21 mmol/L — ABNORMAL LOW (ref 22–32)
Calcium: 9.4 mg/dL (ref 8.9–10.3)
Chloride: 112 mmol/L — ABNORMAL HIGH (ref 98–111)
Creatinine, Ser: 0.94 mg/dL (ref 0.61–1.24)
GFR, Estimated: >60 mL/min (ref >60)
Glucose, Bld: 97 mg/dL (ref 70–99)
Potassium: 3.8 mmol/L (ref 3.5–5.1)
Sodium: 142 mmol/L (ref 135–145)
Total Bilirubin: 0.8 mg/dL (ref 0.3–1.2)
Total Protein: 8.6 g/dL — ABNORMAL HIGH (ref 6.5–8.1)

## 2022-07-16 LAB — RESP PANEL BY RT-PCR (FLU A&B, COVID) ARPGX2
Influenza A by PCR: NEGATIVE
Influenza B by PCR: NEGATIVE
SARS Coronavirus 2 by RT PCR: NEGATIVE

## 2022-07-16 LAB — SALICYLATE LEVEL: Salicylate Lvl: <7 mg/dL — ABNORMAL LOW (ref 7.0–30.0)

## 2022-07-16 LAB — ETHANOL: Alcohol, Ethyl (B): 200 mg/dL — ABNORMAL HIGH (ref <10)

## 2022-07-16 MED ORDER — THIAMINE MONONITRATE 100 MG PO TABS
100.0000 mg | ORAL_TABLET | Freq: Every day | ORAL | Status: DC
  Administered 2022-07-16: 100 mg via ORAL
  Filled 2022-07-16: qty 1

## 2022-07-16 MED ORDER — ACETAMINOPHEN 325 MG PO TABS: 650.0000 mg | ORAL_TABLET | Freq: Four times a day (QID) | ORAL | Status: DC | PRN

## 2022-07-16 MED ORDER — LORAZEPAM 2 MG/ML IJ SOLN: 1.0000 mg | INTRAMUSCULAR | Status: DC | PRN

## 2022-07-16 MED ORDER — HYDROXYZINE HCL 25 MG PO TABS: 25.0000 mg | ORAL_TABLET | Freq: Three times a day (TID) | ORAL | Status: DC | PRN

## 2022-07-16 MED ORDER — FOLIC ACID 1 MG PO TABS
1.0000 mg | ORAL_TABLET | Freq: Every day | ORAL | Status: DC
  Filled 2022-07-16: qty 1

## 2022-07-16 MED ORDER — HALOPERIDOL 5 MG PO TABS: 5.0000 mg | ORAL_TABLET | Freq: Four times a day (QID) | ORAL | Status: DC | PRN

## 2022-07-16 MED ORDER — NICOTINE POLACRILEX 2 MG MT GUM
2.0000 mg | CHEWING_GUM | OROMUCOSAL | Status: DC | PRN
  Filled 2022-07-16: qty 1

## 2022-07-16 MED ORDER — ADULT MULTIVITAMIN W/MINERALS CH
1.0000 | ORAL_TABLET | Freq: Every day | ORAL | Status: DC
  Administered 2022-07-16 – 2022-07-23 (×8): 1 via ORAL
  Filled 2022-07-16 (×11): qty 1

## 2022-07-16 MED ORDER — THIAMINE MONONITRATE 100 MG PO TABS
100.0000 mg | ORAL_TABLET | Freq: Every day | ORAL | Status: DC
  Filled 2022-07-16: qty 1

## 2022-07-16 MED ORDER — BENZTROPINE MESYLATE 1 MG PO TABS: 1.0000 mg | ORAL_TABLET | Freq: Four times a day (QID) | ORAL | Status: DC | PRN

## 2022-07-16 MED ORDER — MAGNESIUM HYDROXIDE 400 MG/5ML PO SUSP: 30.0000 mL | Freq: Every day | ORAL | Status: DC | PRN

## 2022-07-16 MED ORDER — ALUM & MAG HYDROXIDE-SIMETH 200-200-20 MG/5ML PO SUSP: 30.0000 mL | ORAL | Status: DC | PRN

## 2022-07-16 MED ORDER — LORAZEPAM 1 MG PO TABS: 1.0000 mg | ORAL_TABLET | ORAL | Status: DC | PRN

## 2022-07-16 MED ORDER — THIAMINE HCL 100 MG/ML IJ SOLN: 100.0000 mg | Freq: Every day | INTRAMUSCULAR | Status: DC

## 2022-07-16 MED ORDER — TRAZODONE HCL 50 MG PO TABS: 50.0000 mg | ORAL_TABLET | Freq: Every evening | ORAL | Status: DC | PRN

## 2022-07-16 MED ORDER — NICOTINE POLACRILEX 2 MG MT GUM
2.0000 mg | CHEWING_GUM | OROMUCOSAL | Status: DC | PRN
  Administered 2022-07-18 – 2022-07-22 (×3): 2 mg via ORAL
  Filled 2022-07-16 (×3): qty 1

## 2022-07-16 MED ORDER — LORAZEPAM 2 MG PO TABS
2.0000 mg | ORAL_TABLET | ORAL | Status: AC
  Administered 2022-07-16: 2 mg via ORAL
  Filled 2022-07-16: qty 1

## 2022-07-16 MED ORDER — TRAZODONE HCL 50 MG PO TABS
50.0000 mg | ORAL_TABLET | Freq: Every evening | ORAL | Status: DC | PRN
  Administered 2022-07-18: 50 mg via ORAL
  Filled 2022-07-16 (×2): qty 1

## 2022-07-16 MED ORDER — ALUM & MAG HYDROXIDE-SIMETH 200-200-20 MG/5ML PO SUSP: 30.0000 mL | Freq: Four times a day (QID) | ORAL | Status: DC | PRN

## 2022-07-16 MED ORDER — ADULT MULTIVITAMIN W/MINERALS CH
1.0000 | ORAL_TABLET | Freq: Every day | ORAL | Status: DC
  Filled 2022-07-16: qty 1

## 2022-07-16 MED ORDER — LORAZEPAM 1 MG PO TABS
1.0000 mg | ORAL_TABLET | ORAL | Status: DC | PRN
  Administered 2022-07-16: 1 mg via ORAL
  Filled 2022-07-16: qty 1

## 2022-07-16 MED ORDER — HALOPERIDOL 5 MG PO TABS
5.0000 mg | ORAL_TABLET | Freq: Four times a day (QID) | ORAL | Status: DC | PRN
  Administered 2022-07-17: 5 mg via ORAL
  Filled 2022-07-16: qty 1

## 2022-07-16 MED ORDER — VITAMIN B-1 100 MG PO TABS
100.0000 mg | ORAL_TABLET | Freq: Every day | ORAL | Status: DC
  Administered 2022-07-17: 100 mg via ORAL
  Filled 2022-07-16 (×3): qty 1

## 2022-07-16 MED ORDER — IBUPROFEN 600 MG PO TABS: 600.0000 mg | ORAL_TABLET | Freq: Three times a day (TID) | ORAL | Status: DC | PRN

## 2022-07-16 MED ORDER — FOLIC ACID 1 MG PO TABS
1.0000 mg | ORAL_TABLET | Freq: Every day | ORAL | Status: DC
  Administered 2022-07-16 – 2022-07-23 (×8): 1 mg via ORAL
  Filled 2022-07-16 (×10): qty 1

## 2022-07-16 NOTE — ED Notes (Signed)
Pt dressed out in burgundy scrubs with myself and officer present.  Pt cooperative.  Pts belongings, include jacket, hoodie, tshirt, pants, underwear, sneakers, cell phone with cracked screen, miscellaneous papers.  Items bagged, labelled and placed at nurses station.  1 bag in total.  Pt ambulated back to [place] [facility].

## 2022-07-16 NOTE — ED Notes (Signed)
Breakfast meal tray delivered at this time. 

## 2022-07-16 NOTE — ED Notes (Signed)
TTS in interview room speaking to pt at this time. Security outside the interview room at this time.

## 2022-07-16 NOTE — ED Notes (Signed)
Pt tearful and cooperative in triage.  Pt states he "does not want any of his family to be able to contact or visit him or receive any information about him.  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

## 2022-07-16 NOTE — ED Notes (Signed)
Lunch meal tray given at this time.  

## 2022-07-16 NOTE — ED Notes (Signed)
Dr. Toni Amend with psychiatry at bedside at this time. Pt taken to the interview room escorted by security.

## 2022-07-16 NOTE — ED Notes (Signed)
Visitor came to inquire about pt.  Claimed she was his sister, Wandra Mannan.  She was told we could not give any info on the pt.  Ambulated out of ED

## 2022-07-16 NOTE — [facility] Group Notes (Signed)
Pt did not attend wrap-up group   

## 2022-07-16 NOTE — ED Notes (Signed)
Pt swabbed and specimen sent to lab.

## 2022-07-16 NOTE — Progress Notes (Signed)
Pt admitted to [facility] adult unit at this time. Pt reports being placed under IVC after having gun and threatening to shoot self. Pt presents with irritable affect however was cooperative with admission. Pt reports suicidal thoughts "since childhood." Pt reports chronic depression as well as poor sleep. Pt denies a/v hallucinations. Pt endorses daily alcohol use about 7 beers daily with last drink being yesterday. Pt endorses cocaine use at times as well. Pt reports family is his biggest stressor. Pt states he lives with his brother and sister and girlfriend currently but does not plan to return there after discharge. Pt states "They are dead to me, they are only out for their self and they dont care about me."  Pt declines to contact family at this time. Q 15 minute checks initiated.

## 2022-07-16 NOTE — ED Provider Notes (Signed)
[facility] Provider Note    Event Date/Time   First MD Initiated Contact with Patient 07/16/22 0710     (approximate)   History   Psychiatric Evaluation (/)   HPI  Kiam Costanzo is a 31 y.o. male with a past history of bipolar disorder who was brought to the ED under involuntary commitment initiated by his sister due to suicidal thoughts.  Reportedly patient had a plan to shoot himself and obtained a firearm to do so.  Has been using cocaine marijuana and drinking.  He still feels suicidal.  Currently denies any injuries or pain.     Physical Exam   Triage Vital Signs: ED Triage Vitals  Enc Vitals Group     BP 07/16/22 0532 133/89     Pulse Rate 07/16/22 0532 100     Resp 07/16/22 0532 18     Temp 07/16/22 0532 98.8 F (37.1 C)     Temp Source 07/16/22 0532 Oral     SpO2 07/16/22 0532 97 %     Weight 07/16/22 0531 130 lb (59 kg)     Height 07/16/22 0531 6\' 1"  (1.854 m)     Head Circumference --      Peak Flow --      Pain Score 07/16/22 0543 0     Pain Loc --      Pain Edu? --      Excl. in GC? --     Most recent vital signs: Vitals:   07/16/22 0532  BP: 133/89  Pulse: 100  Resp: 18  Temp: 98.8 F (37.1 C)  SpO2: 97%    General: Awake, no distress.  CV:  Good peripheral perfusion.  Regular rate rhythm Resp:  Normal effort.  Clear to auscultation bilaterally Abd:  No distention.  Soft nontender Other:  Moving all extremities.  Cooperative.  No wounds or signs of trauma.   ED Results / Procedures / Treatments   Labs (all labs ordered are listed, but only abnormal results are displayed) Labs Reviewed  COMPREHENSIVE METABOLIC PANEL - Abnormal; Notable for the following components:      Result Value   Chloride 112 (*)    CO2 21 (*)    BUN <5 (*)    Total Protein 8.6 (*)    All other components within normal limits  ETHANOL - Abnormal; Notable for the following components:   Alcohol, Ethyl (B) 200 (*)    All other  components within normal limits  SALICYLATE LEVEL - Abnormal; Notable for the following components:   Salicylate Lvl <7.0 (*)    All other components within normal limits  ACETAMINOPHEN LEVEL - Abnormal; Notable for the following components:   Acetaminophen (Tylenol), Serum <10 (*)    All other components within normal limits  URINE DRUG SCREEN, QUALITATIVE ([facility] ONLY) - Abnormal; Notable for the following components:   Cocaine Metabolite,Ur [place] POSITIVE (*)    Cannabinoid 50 Ng, Ur [place] POSITIVE (*)    All other components within normal limits  RESP PANEL BY RT-PCR (FLU A&B, COVID) ARPGX2  CBC     RADIOLOGY    PROCEDURES:  Procedures   MEDICATIONS ORDERED IN ED: Medications  alum & mag hydroxide-simeth (MAALOX/MYLANTA) 200-200-20 MG/5ML suspension 30 mL (has no administration in time range)  ibuprofen (ADVIL) tablet 600 mg (has no administration in time range)     IMPRESSION / MDM / ASSESSMENT AND PLAN / ED COURSE  I reviewed the triage vital signs and the  nursing notes.                              Patient presents with suicidal plan and polysubstance abuse.  Currently calm in the ED.  Will maintain IVC which was initiated by his sister pending psychiatry evaluation.  No acute medical complaints, medically stable to proceed with psychiatric evaluation and disposition.  The patient has been placed in psychiatric observation due to the need to provide a safe environment for the patient while obtaining psychiatric consultation and evaluation, as well as ongoing medical and medication management to treat the patient's condition.  The patient has been placed under full IVC at this time.       FINAL CLINICAL IMPRESSION(S) / ED DIAGNOSES   Final diagnoses:  Suicidal ideation  Polysubstance abuse (HCC)     Rx / DC Orders   ED Discharge Orders     None        Note:  This document was prepared using Dragon voice recognition software and may include unintentional  dictation errors.   Sharman Cheek, MD 07/16/22 [phone]

## 2022-07-16 NOTE — ED Triage Notes (Signed)
Pt presents via POV for psych eval - pt under IVC. Per paperwork, the patient stated that he wanted to "blow his brains out". Pt in possession of a firearm PTA. Hx of depression and bipolar disorder. Pt still endorses SI at this time.

## 2022-07-16 NOTE — ED Notes (Signed)
Patient has been accepted to [facility].  Patient assigned to room 306-2 Accepting physician is Dr. Loleta Chance.  Call report to [phone].  Representative was [employer].   ER Staff is aware of it:  [facility] ER Secretary  Dr. Scotty Court, ER MD  Earna Coder Patient's Nurse
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
## 2022-07-16 NOTE — Consult Note (Signed)
[facility] Face-to-Face Psychiatry Consult   Reason for Consult: Consult for 31 year old man with a history of mood disorder who presented under IVC filed by his family alleging suicidal ideation Referring Physician: Joni Fears Patient Identification: Austin Owen MRN:  [id number] Principal Diagnosis: Bipolar I disorder with depression, severe ([place]) Diagnosis:  Principal Problem:   Bipolar I disorder with depression, severe ([place]) Active Problems:   Alcohol use disorder   Cocaine use   Total Time spent with patient: 45 minutes  Subjective:   Austin Owen is a 31 y.o. male patient admitted with "I want to die".  HPI: Patient seen and chart reviewed.  31 year old man petitioned by family who report that he has been depressed agitated and making suicidal threats including threatening to shoot himself.  On interview the patient was cooperative but very irritable.  Told me immediately that he wanted to die, he wanted to kill himself and he did not feel there was anything anyone could do about it.  Complains of depressed and hopeless mood all the time, very poor sleep, poor appetite, intrusive thoughts of wanting to die and admits that he had a gun and was planning to shoot himself until he was stopped.  Patient admits that he is drinking on a daily basis and blood alcohol level was 200 on presentation.  Also using cocaine intermittently.  He claims that his use of alcohol and cocaine is done to treat the pain of his chronic back pain that he says is due to kidney stones.  Patient has complaints about his family feeling they do not care about him and also is upset apparently just having learned that his girlfriend's children may not be biologically his.  Patient does not appear to be psychotic and does not appear to be hallucinating but does appear to be in great emotional distress.  Not currently receiving any outpatient psychiatric treatment  Past Psychiatric History: Past history of mental  health treatment having been treated as an adolescent and young adult.  Sounds like he may have been going to the clinics in [facility]  Says that he had been diagnosed at one point with schizophrenia but later told that that was an incorrect diagnosis.  More recently seems to have been diagnosed with bipolar disorder.  Has not been seeing anyone or taking any medicine in quite a while.  Does have a past history of suicide attempts and hospitalization.  Risk to Self:   Risk to Others:   Prior Inpatient Therapy:   Prior Outpatient Therapy:    Past Medical History:  Past Medical History:  Diagnosis Date   Bipolar 1 disorder ([place])    Depression    Glaucoma    Kidney stones    Nephrolithiasis     Past Surgical History:  Procedure Laterality Date   KNEE CARTILAGE SURGERY Right 02/20/2020   Family History: History reviewed. No pertinent family history. Family Psychiatric  History: None reported Social History:  Social History   Substance and Sexual Activity  Alcohol Use Yes   Comment: social     Social History   Substance and Sexual Activity  Drug Use Yes   Frequency: 7.0 times per week   Types: Cocaine, Marijuana   Comment: last used cocaine last week    Social History   Socioeconomic History   Marital status: Significant Other    Spouse name: Not on file   Number of children: Not on file   Years of education: Not on file   Highest education level:  Not on file  Occupational History   Not on file  Tobacco Use   Smoking status: Every Day    Packs/day: 0.50    Types: Cigars, Cigarettes   Smokeless tobacco: Never  Vaping Use   Vaping Use: Never used  Substance and Sexual Activity   Alcohol use: Yes    Comment: social   Drug use: Yes    Frequency: 7.0 times per week    Types: Cocaine, Marijuana    Comment: last used cocaine last week   Sexual activity: Not on file    Comment: last cocaine use 02/24/14  Other Topics Concern   Not on file  Social History  Narrative   Not on file   Social Determinants of Health   Financial Resource Strain: Not on file  Food Insecurity: Not on file  Transportation Needs: Not on file  Physical Activity: Not on file  Stress: Not on file  Social Connections: Not on file   Additional Social History:    Allergies:   Allergies  Allergen Reactions   Peanuts [Peanut Oil] Anaphylaxis   Tylenol [Acetaminophen] Itching    Labs:  Results for orders placed or performed during the hospital encounter of 07/16/22 (from the past 48 hour(s))  Comprehensive metabolic panel     Status: Abnormal   Collection Time: 07/16/22  5:38 AM  Result Value Ref Range   Sodium 142 135 - 145 mmol/L   Potassium 3.8 3.5 - 5.1 mmol/L   Chloride 112 (H) 98 - 111 mmol/L   CO2 21 (L) 22 - 32 mmol/L   Glucose, Bld 97 70 - 99 mg/dL    Comment: Glucose reference range applies only to samples taken after fasting for at least 8 hours.   BUN <5 (L) 6 - 20 mg/dL   Creatinine, Ser 9.38 0.61 - 1.24 mg/dL   Calcium 9.4 8.9 - 18.2 mg/dL   Total Protein 8.6 (H) 6.5 - 8.1 g/dL   Albumin 4.5 3.5 - 5.0 g/dL   AST 19 15 - 41 U/L   ALT 18 0 - 44 U/L   Alkaline Phosphatase 92 38 - 126 U/L   Total Bilirubin 0.8 0.3 - 1.2 mg/dL   GFR, Estimated >99 >37 mL/min    Comment: (NOTE) Calculated using the CKD-EPI Creatinine Equation (2021)    Anion gap 9 5 - 15    Comment: Performed at [facility], [address], [place], [place] [address]  Ethanol     Status: Abnormal   Collection Time: 07/16/22  5:38 AM  Result Value Ref Range   Alcohol, Ethyl (B) 200 (H) <10 mg/dL    Comment: (NOTE) Lowest detectable limit for serum alcohol is 10 mg/dL.  For medical purposes only. Performed at [facility], [address] [place], [place] [address]   Salicylate level     Status: Abnormal   Collection Time: 07/16/22  5:38 AM  Result Value Ref Range   Salicylate Lvl <7.0 (L) 7.0 - 30.0 mg/dL    Comment: Performed at [facility], [address] [place], [place] [address]  Acetaminophen level     Status: Abnormal   Collection Time: 07/16/22  5:38 AM  Result Value Ref Range   Acetaminophen (Tylenol), Serum <10 (L) 10 - 30 ug/mL    Comment: (NOTE) Therapeutic concentrations vary significantly. A range of 10-30 ug/mL  may be an effective concentration for many patients. However, some  are best treated at concentrations outside of this range. Acetaminophen  concentrations >150 ug/mL at 4 hours after ingestion  and >50 ug/mL at 12 hours after ingestion are often associated with  toxic reactions.  Performed at [facility], [address] [place], [place] [address]   cbc     Status: None   Collection Time: 07/16/22  5:38 AM  Result Value Ref Range   WBC 8.1 4.0 - 10.5 K/uL   RBC 4.57 4.22 - 5.81 MIL/uL   Hemoglobin 15.2 13.0 - 17.0 g/dL   HCT 45.6 39.0 - 52.0 %   MCV 99.8 80.0 - 100.0 fL   MCH 33.3 26.0 - 34.0 pg   MCHC 33.3 30.0 - 36.0 g/dL   RDW 12.2 11.5 - 15.5 %   Platelets 360 150 - 400 K/uL   nRBC 0.0 0.0 - 0.2 %    Comment: Performed at [facility], [address], [place], [place] [address]  Urine Drug Screen, Qualitative     Status: Abnormal   Collection Time: 07/16/22  5:38 AM  Result Value Ref Range   Tricyclic, Ur Screen NONE DETECTED NONE DETECTED   Amphetamines, Ur Screen NONE DETECTED NONE DETECTED   MDMA (Ecstasy)Ur Screen NONE DETECTED NONE DETECTED   Cocaine Metabolite,Ur [place] POSITIVE (A) NONE DETECTED   Opiate, Ur Screen NONE DETECTED NONE DETECTED   Phencyclidine (PCP) Ur S NONE DETECTED NONE DETECTED   Cannabinoid 50 Ng, Ur [place] POSITIVE (A) NONE DETECTED   Barbiturates, Ur Screen NONE DETECTED NONE DETECTED   Benzodiazepine, Ur Scrn NONE DETECTED NONE DETECTED   Methadone Scn, Ur NONE DETECTED NONE DETECTED    Comment: (NOTE) Tricyclics + metabolites, urine    Cutoff 1000 ng/mL Amphetamines + metabolites, urine  Cutoff 1000 ng/mL MDMA (Ecstasy), urine               Cutoff 500 ng/mL Cocaine Metabolite, urine          Cutoff 300 ng/mL Opiate + metabolites, urine        Cutoff 300 ng/mL Phencyclidine (PCP), urine         Cutoff 25 ng/mL Cannabinoid, urine                 Cutoff 50 ng/mL Barbiturates + metabolites, urine  Cutoff 200 ng/mL Benzodiazepine, urine              Cutoff 200 ng/mL Methadone, urine                   Cutoff 300 ng/mL  The urine drug screen provides only a preliminary, unconfirmed analytical test result and should not be used for non-medical purposes. Clinical consideration and professional judgment should be applied to any positive drug screen result due to possible interfering substances. A more specific alternate chemical method must be used in order to obtain a confirmed analytical result. Gas chromatography / mass spectrometry (GC/MS) is the preferred confirm atory method. Performed at [facility], [address] [place], [place] [address]   Resp Panel by RT-PCR (Flu A&B, Covid) Anterior Nasal Swab     Status: None   Collection Time: 07/16/22  5:50 AM   Specimen: Anterior Nasal Swab  Result Value Ref Range   SARS Coronavirus 2 by RT PCR NEGATIVE NEGATIVE    Comment: (NOTE) SARS-CoV-2 target nucleic acids are NOT DETECTED.  The SARS-CoV-2 RNA is generally detectable in upper respiratory specimens during the acute phase of infection. The lowest concentration of SARS-CoV-2 viral copies this assay can detect is 138 copies/mL. A  negative result does not preclude SARS-Cov-2 infection and should not be used as the sole basis for treatment or other patient management decisions. A negative result may occur with  improper specimen collection/handling, submission of specimen other than nasopharyngeal swab, presence of viral mutation(s) within the areas targeted by this assay, and inadequate number of viral copies(<138 copies/mL). A negative result must be combined with clinical observations, patient history,  and epidemiological information. The expected result is Negative.  Fact Sheet for Patients:  EntrepreneurPulse.com.au  Fact Sheet for Healthcare Providers:  IncredibleEmployment.be  This test is no t yet approved or cleared by the [place] FDA and  has been authorized for detection and/or diagnosis of SARS-CoV-2 by FDA under an Emergency Use Authorization (EUA). This EUA will remain  in effect (meaning this test can be used) for the duration of the COVID-19 declaration under Section 564(b)(1) of the Act, 21 U.S.C.section 360bbb-3(b)(1), unless the authorization is terminated  or revoked sooner.       Influenza A by PCR NEGATIVE NEGATIVE   Influenza B by PCR NEGATIVE NEGATIVE    Comment: (NOTE) The Xpert Xpress SARS-CoV-2/FLU/RSV plus assay is intended as an aid in the diagnosis of influenza from Nasopharyngeal swab specimens and should not be used as a sole basis for treatment. Nasal washings and aspirates are unacceptable for Xpert Xpress SARS-CoV-2/FLU/RSV testing.  Fact Sheet for Patients: EntrepreneurPulse.com.au  Fact Sheet for Healthcare Providers: IncredibleEmployment.be  This test is not yet approved or cleared by the [place] FDA and has been authorized for detection and/or diagnosis of SARS-CoV-2 by FDA under an Emergency Use Authorization (EUA). This EUA will remain in effect (meaning this test can be used) for the duration of the COVID-19 declaration under Section 564(b)(1) of the Act, 21 U.S.C. section 360bbb-3(b)(1), unless the authorization is terminated or revoked.  Performed at [facility], [address] [place], [place] [address]     Current Facility-Administered Medications  Medication Dose Route Frequency Provider Last Rate Last Admin   alum & mag hydroxide-simeth (MAALOX/MYLANTA) 200-200-20 MG/5ML suspension 30 mL  30 mL Oral Q6H PRN Carrie Mew, MD        ibuprofen (ADVIL) tablet 600 mg  600 mg Oral Q8H PRN Carrie Mew, MD       nicotine polacrilex (NICORETTE) gum 2 mg  2 mg Oral PRN Jhayla Podgorski, Madie Reno, MD       thiamine (VITAMIN B1) tablet 100 mg  100 mg Oral Daily Bensen Chadderdon, Madie Reno, MD   100 mg at 07/16/22 1233   Current Outpatient Medications  Medication Sig Dispense Refill   cyclobenzaprine (FLEXERIL) 5 MG tablet Take 1-2 tablets 3 times daily as needed (Patient not taking: Reported on 07/16/2022) 15 tablet 0   HYDROcodone-acetaminophen (NORCO) 5-325 MG tablet Take 1 tablet by mouth every 4 (four) hours as needed for moderate pain. (Patient not taking: Reported on 07/16/2022) 6 tablet 0   ibuprofen (ADVIL,MOTRIN) 600 MG tablet Take 1 tablet (600 mg total) by mouth every 6 (six) hours as needed. (Patient not taking: Reported on 07/16/2022) 30 tablet 0    Musculoskeletal: Strength & Muscle Tone: within normal limits Gait & Station: normal Patient leans: N/A            Psychiatric Specialty Exam:  Presentation  General Appearance: No data recorded Eye Contact:No data recorded Speech:No data recorded Speech Volume:No data recorded Handedness:No data recorded  Mood and Affect  Mood:No data recorded Affect:No data recorded  Thought Process  Thought Processes:No data  recorded Descriptions of Associations:No data recorded Orientation:No data recorded Thought Content:No data recorded History of Schizophrenia/Schizoaffective disorder:No  Duration of Psychotic Symptoms:No data recorded Hallucinations:No data recorded Ideas of Reference:No data recorded Suicidal Thoughts:No data recorded Homicidal Thoughts:No data recorded  Sensorium  Memory:No data recorded Judgment:No data recorded Insight:No data recorded  Executive Functions  Concentration:No data recorded Attention Span:No data recorded Recall:No data recorded [employer] [place] recorded Language:No data recorded  Psychomotor Activity   Psychomotor Activity:No data recorded  Assets  Assets:No data recorded  Sleep  Sleep:No data recorded  Physical Exam: Physical Exam Vitals and nursing note reviewed.  Constitutional:      Appearance: Normal appearance.  HENT:     Head: Normocephalic and atraumatic.     Mouth/Throat:     Pharynx: Oropharynx is clear.  Eyes:     Pupils: Pupils are equal, round, and reactive to light.  Cardiovascular:     Rate and Rhythm: Normal rate and regular rhythm.  Pulmonary:     Effort: Pulmonary effort is normal.     Breath sounds: Normal breath sounds.  Abdominal:     General: Abdomen is flat.     Palpations: Abdomen is soft.  Musculoskeletal:        General: Normal range of motion.  Skin:    General: Skin is warm and dry.  Neurological:     General: No focal deficit present.     Mental Status: He is alert. Mental status is at baseline.  Psychiatric:        Attention and Perception: Attention normal.        Mood and Affect: Mood is depressed. Affect is labile.        Speech: Speech normal.        Behavior: Behavior is agitated. Behavior is not aggressive.        Thought Content: Thought content includes suicidal ideation. Thought content includes suicidal plan.        Cognition and Memory: Cognition normal.        Judgment: Judgment is impulsive and inappropriate.    Review of Systems  Constitutional: Negative.   HENT: Negative.    Eyes: Negative.   Respiratory: Negative.    Cardiovascular: Negative.   Gastrointestinal: Negative.   Musculoskeletal:  Positive for back pain.  Skin: Negative.   Neurological: Negative.   Psychiatric/Behavioral:  Positive for depression, substance abuse and suicidal ideas. Negative for hallucinations. The patient is nervous/anxious and has insomnia.    Blood pressure 133/89, pulse 100, temperature 98.8 F (37.1 C), temperature source Oral, resp. rate 18, height 6\' 1"  (1.854 m), weight 59 kg, SpO2 97 %. Body mass index is 17.15  kg/m.  Treatment Plan Summary: Daily contact with patient to assess and evaluate symptoms and progress in treatment, Medication management, and Plan 31 year old man with bipolar disorder currently with symptoms of severe depression with active suicidal thoughts.  Patient meets criteria for commitment and meets criteria for inpatient hospitalization.  No beds available at [facility] he is being referred to behavioral health hospital in [place] for admission.  Labs reviewed.  For now gave patient 2 mg of oral Ativan x 1, oral thiamine and nicotine gum.  We can add withdrawal orders on the chance that he may need alcohol withdrawal treatment.  In communication with [facility] regarding transfer.  Case reviewed with the ER physician.  Disposition: Recommend psychiatric Inpatient admission when medically cleared.  Alethia Berthold, MD 07/16/2022 1:14 PM

## 2022-07-16 NOTE — Progress Notes (Signed)
   07/16/22 2144  Psych Admission Type (Psych Patients Only)  Admission Status Involuntary  Psychosocial Assessment  Patient Complaints Depression;Irritability;Insomnia  Eye Contact Brief  Facial Expression Flat  Affect Appropriate to circumstance  Speech Logical/coherent  Interaction Cautious  Motor Activity Other (Comment) (WDL)  Appearance/Hygiene In scrubs  Behavior Characteristics Appropriate to situation  Mood Irritable;Depressed  Thought Process  Coherency WDL  Content WDL  Delusions None reported or observed  Perception WDL  Hallucination None reported or observed  Judgment Poor  Confusion None  Danger to Self  Current suicidal ideation? Denies  Agreement Not to Harm Self Yes  Description of Agreement verbal  Danger to Others  Danger to Others None reported or observed

## 2022-07-16 NOTE — ED Notes (Signed)
Per Cordelia Pen NT, pt is wanting to leave. I explained to the pt he is under IVC and will be getting transferred to [facility] this afternoon and could not leave at this time. Pt stated "lets go then, hurry it up." I told the pt we are just waiting for transport to arrive. He then stated "Well lets hurry it up or imma start popping off in this place, I will lose my mind." This RN asked pt if I can get him something to help him calm down, he denied any meds. Pt also stated his frustration with family life, helping to raise 3 kids and sister taking IVC papers on him while letting ex girlfriend stay at their house.

## 2022-07-17 DIAGNOSIS — F609 Personality disorder, unspecified: Secondary | ICD-10-CM | POA: Insufficient documentation

## 2022-07-17 DIAGNOSIS — F4310 Post-traumatic stress disorder, unspecified: Secondary | ICD-10-CM | POA: Insufficient documentation

## 2022-07-17 DIAGNOSIS — F314 Bipolar disorder, current episode depressed, severe, without psychotic features: Principal | ICD-10-CM

## 2022-07-17 LAB — COMPREHENSIVE METABOLIC PANEL
ALT: 20 U/L (ref 0–44)
AST: 22 U/L (ref 15–41)
Albumin: 4.2 g/dL (ref 3.5–5.0)
Alkaline Phosphatase: 89 U/L (ref 38–126)
Anion gap: 9 (ref 5–15)
BUN: 10 mg/dL (ref 6–20)
CO2: 24 mmol/L (ref 22–32)
Calcium: 9.6 mg/dL (ref 8.9–10.3)
Chloride: 106 mmol/L (ref 98–111)
Creatinine, Ser: 0.99 mg/dL (ref 0.61–1.24)
GFR, Estimated: >60 mL/min (ref >60)
Glucose, Bld: 94 mg/dL (ref 70–99)
Potassium: 3.8 mmol/L (ref 3.5–5.1)
Sodium: 139 mmol/L (ref 135–145)
Total Bilirubin: 2 mg/dL — ABNORMAL HIGH (ref 0.3–1.2)
Total Protein: 8.2 g/dL — ABNORMAL HIGH (ref 6.5–8.1)

## 2022-07-17 LAB — CBC
HCT: 45.5 % (ref 39.0–52.0)
Hemoglobin: 15 g/dL (ref 13.0–17.0)
MCH: 33.3 pg (ref 26.0–34.0)
MCHC: 33 g/dL (ref 30.0–36.0)
MCV: 100.9 fL — ABNORMAL HIGH (ref 80.0–100.0)
Platelets: 347 10*3/uL (ref 150–400)
RBC: 4.51 MIL/uL (ref 4.22–5.81)
RDW: 12.2 % (ref 11.5–15.5)
WBC: 7.2 10*3/uL (ref 4.0–10.5)
nRBC: 0 % (ref 0.0–0.2)

## 2022-07-17 LAB — TSH: TSH: 0.91 u[IU]/mL (ref 0.350–4.500)

## 2022-07-17 LAB — LIPID PANEL
Cholesterol: 127 mg/dL (ref 0–200)
HDL: 38 mg/dL — ABNORMAL LOW (ref >40)
LDL Cholesterol: 74 mg/dL (ref 0–99)
Total CHOL/HDL Ratio: 3.3 RATIO
Triglycerides: 74 mg/dL (ref <150)
VLDL: 15 mg/dL (ref 0–40)

## 2022-07-17 MED ORDER — LOPERAMIDE HCL 2 MG PO CAPS: 2.0000 mg | ORAL_CAPSULE | ORAL | Status: AC | PRN

## 2022-07-17 MED ORDER — CHLORDIAZEPOXIDE HCL 25 MG PO CAPS: 25.0000 mg | ORAL_CAPSULE | Freq: Three times a day (TID) | ORAL | Status: DC

## 2022-07-17 MED ORDER — CHLORDIAZEPOXIDE HCL 25 MG PO CAPS
25.0000 mg | ORAL_CAPSULE | Freq: Four times a day (QID) | ORAL | Status: DC
  Administered 2022-07-17: 25 mg via ORAL
  Filled 2022-07-17: qty 1

## 2022-07-17 MED ORDER — HYDROXYZINE HCL 25 MG PO TABS
25.0000 mg | ORAL_TABLET | Freq: Four times a day (QID) | ORAL | Status: AC | PRN
  Administered 2022-07-18 – 2022-07-19 (×3): 25 mg via ORAL
  Filled 2022-07-17 (×4): qty 1

## 2022-07-17 MED ORDER — CHLORDIAZEPOXIDE HCL 25 MG PO CAPS: 25.0000 mg | ORAL_CAPSULE | Freq: Every day | ORAL | Status: DC

## 2022-07-17 MED ORDER — CHLORDIAZEPOXIDE HCL 25 MG PO CAPS: 25.0000 mg | ORAL_CAPSULE | ORAL | Status: DC

## 2022-07-17 MED ORDER — PRAZOSIN HCL 1 MG PO CAPS
1.0000 mg | ORAL_CAPSULE | Freq: Every day | ORAL | Status: DC
  Administered 2022-07-17 – 2022-07-22 (×6): 1 mg via ORAL
  Filled 2022-07-17 (×10): qty 1

## 2022-07-17 MED ORDER — ONDANSETRON 4 MG PO TBDP: 4.0000 mg | ORAL_TABLET | Freq: Four times a day (QID) | ORAL | Status: AC | PRN

## 2022-07-17 MED ORDER — VITAMIN B-1 100 MG PO TABS
100.0000 mg | ORAL_TABLET | Freq: Every day | ORAL | Status: DC
  Administered 2022-07-18 – 2022-07-23 (×6): 100 mg via ORAL
  Filled 2022-07-17 (×8): qty 1

## 2022-07-17 MED ORDER — LORAZEPAM 1 MG PO TABS: 1.0000 mg | ORAL_TABLET | Freq: Four times a day (QID) | ORAL | Status: DC | PRN

## 2022-07-17 NOTE — BHH Suicide Risk Assessment (Signed)
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              Tobacco Use Disorder             -- Nicotine gum ordered             -- Smoking cessation encouraged   4. Discharge Planning:              -- Social work and case management to assist with discharge planning and identification of hospital follow-up needs prior to discharge             -- Estimated LOS: 5-7 days             -- Discharge Concerns: Need to establish a safety plan; Medication compliance and effectiveness             -- Discharge Goals: Return home with outpatient referrals for mental health follow-up including medication management/psychotherapy             -- Patient reports his preferred disposition is to go live with a friend (declines to say who or allow other collateral). States he  feels his romantic relationship has ended and that he does not want to live with his sister anymore.              -- Sister states he can return to her house and she feels his girlfriend would welcome this as well.              --Can discuss rehab if patient is willing to admit to more serious alcohol use  I certify that inpatient services furnished can reasonably be expected to improve the patient's condition.   Corky Sox, MD 07/17/2022, 4:23 PM

## 2022-07-17 NOTE — Group Note (Signed)
[facility] LCSW Group Therapy Note  Date/Time:    07/17/2022   Type of Therapy and Topic:  Group Therapy:  Shame and its Impact on My Life  Participation Level:  Minimal   Description of Group:  The focus of this group was to examine our tendency to be hyper-critical of self and how this leads to feelings of worthlessness, hopelessness, and shame.  Patients were guided to the concept that shame is universal and is worsened by being kept hidden, but improved by being revealed.  We discussed how feeling unworthy is the result of shame and discussed the differences between guilt  ("I did something bad" "I made a mistake" "I did something stupid") and shame ("I am bad" "I am a mistake" "I am stupid") .  We discussed that feelings are not necessarily based in facts.  We also talked about what happens when we do something to numb our negative feelings and how that actually numbs our positive feelings at the same time.  Therapeutic Goals Identify statements patients automatically say to themselves, "I'll be worthy when...."  Examine how this is unkind to ourselves because it indicates we cannot be worthy until some far-reaching, possibly even unreachable, goal is achieved. Talk about the frequent use of unhealthy coping skills used when feeling unworthy Practice 4 coping skills briefly (5 senses mindfulness, Camera Zoom In and Out, Grounding, 5-finger breathing) Allow patients to discuss their shame out loud in order to reduce its power over them  Summary of Patient Progress: During group, patient expressed "I'll be worthy when "I'm out of here."   He went on to speak angrily about how he does not belong here, then left the room abruptly.  Therapeutic Modalities Processing  Ambrose Mantle, LCSW 07/17/2022, 1:18 PM

## 2022-07-17 NOTE — Progress Notes (Signed)
Adult Psychoeducational Group Note  Date:  07/17/2022 Time:  9:50 PM  Group Topic/Focus:  Wrap-Up Group:   The focus of this group is to help patients review their daily goal of treatment and discuss progress on daily workbooks.  Participation Level:  Did Not Attend  Participation Quality:  Did Not Attend  Affect:  Did Not Attend  Cognitive:  Did Not Attend  Insight: Did Not Attend  Engagement in Group:  Did Not Attend  Modes of Intervention:  Did Not Attend  Additional Comments:   Pt was encouraged but refused to attend group    Vevelyn Pat 07/17/2022, 9:50 PM

## 2022-07-17 NOTE — Progress Notes (Signed)
Pt approached staff this afternoon with angry affect. Pt stating he called his sister and she told him she had spoken to someone from [facility]. Pt stating no one is allowed to talk to his family because they are just going to lie to staff to make him stay here longer. Pt now denying suicidal thoughts stating he never had a gun in his possession that the police were called by his sister because she lied to them. Pt talked with staff until calm. Pt states he is not trusting of staff and will not eat dinner due to frustration. Dinner tray saved for patient, pt expressing wanting to leave hospital. Nurse explained IVC. Pt verbalized understanding; and remains irritable but cooperative.
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
## 2022-07-17 NOTE — Progress Notes (Signed)
Pt presents with tearful affect this morning. Pt expresses anger regarding being placed under IVC. Pt states " My girlfriend cheated on me. The kids I thought were mine now maybe aren't but Im the one they put in here. I just need to get out of here when can I do that?" Pt endorses continued anxiety and depression according to what he wrote on self inventory sheet "1,000,000 on a 1-10 scale." Pt reports ativan did help pt sleep last night. Nurse educated pt about symptoms of and complications of alcohol withdrawal, pt verbalized understanding. Pt took first dose of librium today. Q 15 minute checks ongoing.

## 2022-07-17 NOTE — Progress Notes (Signed)
   07/17/22 2304  Psych Admission Type (Psych Patients Only)  Admission Status Involuntary  Psychosocial Assessment  Patient Complaints Depression;Irritability  Eye Contact Fair  Facial Expression Flat  Affect Appropriate to circumstance  Speech Logical/coherent  Interaction Assertive  Motor Activity Other (Comment) (WDL)  Appearance/Hygiene Unremarkable  Behavior Characteristics Appropriate to situation  Mood Depressed  Thought Process  Coherency WDL  Content WDL  Delusions None reported or observed  Perception WDL  Hallucination None reported or observed  Judgment Poor  Confusion None  Danger to Self  Current suicidal ideation? Denies  Agreement Not to Harm Self Yes  Description of Agreement verbal  Danger to Others  Danger to Others None reported or observed

## 2022-07-18 ENCOUNTER — Encounter (HOSPITAL_COMMUNITY): Payer: Self-pay

## 2022-07-18 LAB — HEMOGLOBIN A1C
Hgb A1c MFr Bld: 4.7 % — ABNORMAL LOW (ref 4.8–5.6)
Mean Plasma Glucose: 88 mg/dL

## 2022-07-18 MED ORDER — ESCITALOPRAM OXALATE 5 MG PO TABS
5.0000 mg | ORAL_TABLET | Freq: Every day | ORAL | Status: DC
  Administered 2022-07-18 – 2022-07-19 (×2): 5 mg via ORAL
  Filled 2022-07-18 (×5): qty 1
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
## 2022-07-18 NOTE — BH IP Treatment Plan (Signed)
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 [facility])   PTSD (post-traumatic stress disorder)   Current Medications:  Current Facility-Administered Medications  Medication Dose Route Frequency Provider Last Rate Last Admin   alum & mag hydroxide-simeth (MAALOX/MYLANTA) 200-200-20 MG/5ML suspension 30 mL  30 mL Oral Q4H PRN Clapacs, John T, MD       haloperidol (HALDOL) tablet 5 mg  5 mg Oral Q6H PRN Clapacs, Jackquline Denmark, MD   5 mg at 07/17/22 1920   And   benztropine (COGENTIN) tablet 1 mg  1 mg Oral Q6H PRN Clapacs, Jackquline Denmark, MD       folic acid (FOLVITE) tablet 1 mg  1 mg Oral Daily Ntuen, Jesusita Oka, FNP   1 mg at 07/18/22 9798   hydrOXYzine (ATARAX) tablet 25 mg  25 mg Oral Q6H PRN Carlyn Reichert, MD   25 mg at 07/18/22 1041   ibuprofen (ADVIL) tablet 600 mg  600 mg Oral Q8H PRN Clapacs, Jackquline Denmark, MD       loperamide (IMODIUM) capsule 2-4 mg  2-4 mg Oral PRN Carlyn Reichert, MD       LORazepam (ATIVAN) tablet 1 mg  1 mg Oral Q6H PRN Carlyn Reichert, MD       magnesium hydroxide (MILK OF MAGNESIA) suspension 30 mL  30 mL Oral Daily PRN Clapacs, Jackquline Denmark, MD       multivitamin with minerals tablet 1 tablet  1 tablet Oral Daily Ntuen, Jesusita Oka, FNP   1 tablet at 07/18/22 9211   nicotine polacrilex (NICORETTE) gum 2 mg  2 mg Oral PRN Clapacs, Jackquline Denmark, MD   2 mg at 07/18/22 0813   ondansetron (ZOFRAN-ODT) disintegrating tablet 4 mg  4 mg Oral Q6H PRN Carlyn Reichert, MD       prazosin (MINIPRESS) capsule 1 mg  1 mg Oral QHS Carlyn Reichert, MD   1 mg at 07/17/22 2219   thiamine (Vitamin B-1) tablet 100 mg  100 mg  Oral Daily Carlyn Reichert, MD   100 mg at 07/18/22 9417   traZODone (DESYREL) tablet 50 mg  50 mg Oral QHS PRN Ntuen, Jesusita Oka, FNP       PTA Medications: Medications Prior to Admission  Medication Sig Dispense Refill Last Dose   cyclobenzaprine (FLEXERIL) 5 MG tablet Take 1-2 tablets 3 times daily as needed (Patient not taking: Reported on 07/16/2022) 15 tablet 0    HYDROcodone-acetaminophen (NORCO) 5-325 MG tablet Take 1 tablet by mouth every 4 (four) hours as needed for moderate pain. (Patient not taking: Reported on 07/16/2022) 6 tablet 0    ibuprofen (ADVIL,MOTRIN) 600 MG tablet Take 1 tablet (600 mg total) by mouth every 6 (six) hours as needed. (Patient not taking: Reported on 07/16/2022) 30 tablet 0     Patient Stressors:    Patient Strengths:    Treatment Modalities: Medication Management, Group therapy, Case management,  1 to 1 session with clinician, Psychoeducation, Recreational therapy.   Physician Treatment Plan for Primary Diagnosis: MDD (major depressive disorder), recurrent severe, without psychosis (HCC) Long Term Goal(s): Improvement in symptoms so as ready for discharge   Short Term Goals: Ability to identify changes in  lifestyle to reduce recurrence of condition will improve Ability to maintain clinical measurements within normal limits will improve Compliance with prescribed medications will improve Ability to identify triggers associated with substance abuse/mental health issues will improve Ability to verbalize feelings will improve Ability to disclose and discuss suicidal ideas  Medication Management: Evaluate patient's response, side effects, and tolerance of medication regimen.  Therapeutic Interventions: 1 to 1 sessions, Unit Group sessions and Medication administration.  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 [facility])   PTSD (post-traumatic stress disorder)  Long Term Goal(s): Improvement in symptoms so as ready for discharge   Short Term Goals: Ability to identify changes in lifestyle to reduce recurrence of condition will improve Ability to maintain clinical measurements within normal limits will improve Compliance with prescribed medications will improve Ability to identify triggers associated with substance abuse/mental health issues will improve Ability to verbalize feelings will improve Ability to disclose and discuss suicidal ideas     Medication Management: Evaluate patient's response, side effects, and tolerance of medication regimen.  Therapeutic Interventions: 1 to 1 sessions, Unit Group sessions and Medication administration.  Evaluation of Outcomes: Progressing   RN Treatment Plan for Primary Diagnosis: MDD (major depressive disorder), recurrent severe, without psychosis (HCC) Long Term Goal(s): Knowledge of disease and therapeutic regimen to maintain health will improve  Short Term Goals: Ability to remain free from injury will improve, Ability to verbalize frustration and anger appropriately will improve, Ability to demonstrate self-control, Ability to participate in decision making will improve, Ability to verbalize feelings will improve, Ability to disclose and discuss suicidal ideas, Ability to identify and develop effective coping behaviors will improve, and Compliance with prescribed medications will improve  Medication Management: RN will administer medications as ordered by provider, will assess and evaluate patient's response and provide education to patient for prescribed medication. RN will report any adverse and/or side effects to prescribing provider.  Therapeutic Interventions: 1 on 1 counseling sessions, Psychoeducation, Medication administration, Evaluate responses  to treatment, Monitor vital signs and CBGs as ordered, Perform/monitor CIWA, COWS, AIMS and Fall Risk screenings as ordered, Perform wound care treatments as ordered.  Evaluation of Outcomes: Progressing   LCSW Treatment Plan for Primary Diagnosis: MDD (major depressive disorder), recurrent severe, without psychosis (HCC) Long Term Goal(s): Safe transition to appropriate next level of care at discharge, Engage patient in therapeutic group addressing interpersonal concerns.  Short Term Goals: Engage patient in aftercare planning with referrals and resources, Increase social support, Increase ability to appropriately verbalize feelings, Increase emotional regulation, Facilitate acceptance of mental health diagnosis and concerns, Facilitate patient progression through stages of change regarding substance use diagnoses and concerns, Identify triggers associated with mental health/substance abuse issues, and Increase skills for wellness and recovery  Therapeutic Interventions: Assess for all discharge needs, 1 to 1 time with Social worker, Explore available resources and support systems, Assess for adequacy in community support network, Educate family and significant other(s) on suicide prevention, Complete Psychosocial Assessment, Interpersonal group therapy.  Evaluation of Outcomes: Progressing   Progress in Treatment: Attending groups: Yes. Participating in groups: Yes. Taking medication as prescribed: Yes. Toleration medication: Yes. Family/Significant other contact made: No, will contact:  patient has not provided consent Patient understands diagnosis: No. Discussing patient identified problems/goals with staff: No. Medical problems stabilized or  resolved: Yes. Denies suicidal/homicidal ideation: Yes. Issues/concerns per patient self-inventory: No.   New problem(s) identified: No, Describe:  none reported   New Short Term/Long Term Goal(s): detox, medication management for mood  stabilization; elimination of SI thoughts; development of comprehensive mental wellness/sobriety plan    Patient Goals:  Pt states, "I want to get out of here"  Discharge Plan or Barriers:  Patient recently admitted. CSW will continue to follow and assess for appropriate referrals and possible discharge planning.    Reason for Continuation of Hospitalization: Aggression Anxiety Depression Medication stabilization Suicidal ideation Withdrawal symptoms  Estimated Length of Stay: 3-5 days  Last 3 [place] Suicide Severity Risk Score: Flowsheet Row [facility] Most recent reading at 07/16/2022  6:00 PM ED from [facility]16/2022 in [facility] Most recent reading at 07/16/2022  5:44 AM  C-SSRS RISK CATEGORY High Risk High Risk       Last PHQ 2/9 Scores:     No data to display          Scribe for Treatment Team: Beatris Si, LCSW 07/18/2022 3:03 PM

## 2022-07-18 NOTE — Progress Notes (Signed)
D:  Patient denied SI and HI, contracts for safety.  Denied A/V hallucinations.   A:  Medications administered per MD orders.  Emotional support and encouragement given patient. R:  Safety maintained with 15 minute checks.  

## 2022-07-18 NOTE — Group Note (Signed)
Occupational Therapy Group Note  Group Topic:Coping Skills  Group Date: 07/18/2022 Start Time: 1430 End Time: 1510 Facilitators: Ted Mcalpine, OT   Group Description: Group encouraged increased engagement and participation through discussion and activity focused on "Coping Ahead." Patients were split up into teams and selected a card from a stack of positive coping strategies. Patients were instructed to act out/charade the coping skill for other peers to guess and receive points for their team. Discussion followed with a focus on identifying additional positive coping strategies and patients shared how they were going to cope ahead over the weekend while continuing hospitalization stay.  Therapeutic Goal(s): Identify positive vs negative coping strategies. Identify coping skills to be used during hospitalization vs coping skills outside of hospital/at home Increase participation in therapeutic group environment and promote engagement in treatment   Participation Level: Active   Participation Quality: Independent   Behavior: Appropriate   Speech/Thought Process: Relevant   Affect/Mood: Appropriate   Insight: Fair   Judgement: Fair   Individualization: pt was active in their participation of group discussion/activity. New skills identified  Modes of Intervention: Discussion  Patient Response to Interventions:  Attentive   Plan: Continue to engage patient in OT groups 2 - 3x/week.  07/18/2022  Ted Mcalpine, OT  Kerrin Champagne, OT

## 2022-07-18 NOTE — Plan of Care (Signed)
Nurse discussed anxiety with patient.  

## 2022-07-18 NOTE — Group Note (Signed)
Recreation Therapy Group Note   Group Topic:Stress Management  Group Date: 07/18/2022 Start Time: 0930 End Time: 0950 Facilitators: Dejae Bernet-McCall, LRT,CTRS Location: [address]   Goal Area(s) Addresses:  Patient will identify positive stress management techniques. Patient will identify benefits of using stress management post d/c.     Group Description:  Meditation.  LRT discussed meditation with patients and explained to them to focus on their breathing.  LRT then played a meditation that focused on waking up to a new day and making the most of it.  Patients were to focus on inviting good feelings, peace and bliss.  Patients were to engage and follow along as meditation played to get the full experience of the meditation.   Affect/Mood: Appropriate   Participation Level: Active   Participation Quality: Independent   Behavior: Appropriate   Speech/Thought Process: Focused   Insight: Good   Judgement: Good   Modes of Intervention: Worksheet   Patient Response to Interventions:  Engaged   Education Outcome:  Acknowledges education and In group clarification offered    Clinical Observations/Individualized Feedback: Pt attended and participated in group session.    Plan: Continue to engage patient in RT group sessions 2-3x/week.   Loreli Debruler-McCall, LRT,CTRS 07/18/2022 11:36 AM

## 2022-07-18 NOTE — [facility] Group Notes (Signed)
Spiritual care group on grief and loss facilitated by Chaplain Dyanne Carrel, BCC, Chaplain Genesis Adams and Rene Paci, couseling intern.    Group Goal:  Support / Education around grief and loss  Members engage in facilitated group support and psycho-social education.  Group Description:  Following introductions and group rules, group members engaged in facilitated group dialog and support around topic of loss, with particular support around experiences of loss in their lives. Group Identified types of loss (relationships / self / things) and identified patterns, circumstances, and changes that precipitate losses. Reflected on thoughts / feelings around loss, normalized grief responses, and recognized variety in grief experience.   Group drew on Adlerian / Rogerian, narrative, MI,  Patient Progress: Chistian attended group and actively engaged and participated in group conversation.  His comments showed good insight and he was supportive of peers.    [address], Bcc Pager, [phone]

## 2022-07-18 NOTE — Progress Notes (Signed)
Pt attended A/A 

## 2022-07-19 LAB — URINALYSIS, COMPLETE (UACMP) WITH MICROSCOPIC
Bacteria, UA: NONE SEEN
Bilirubin Urine: NEGATIVE
Glucose, UA: NEGATIVE mg/dL
Hgb urine dipstick: NEGATIVE
Ketones, ur: NEGATIVE mg/dL
Nitrite: NEGATIVE
Protein, ur: NEGATIVE mg/dL
Specific Gravity, Urine: 1.008 (ref 1.005–1.030)
pH: 6 (ref 5.0–8.0)

## 2022-07-19 LAB — RAPID URINE DRUG SCREEN, HOSP PERFORMED
Amphetamines: NOT DETECTED
Barbiturates: NOT DETECTED
Benzodiazepines: POSITIVE — AB
Cocaine: NOT DETECTED
Opiates: NOT DETECTED
Tetrahydrocannabinol: POSITIVE — AB

## 2022-07-19 LAB — RESP PANEL BY RT-PCR (RSV, FLU A&B, COVID)  RVPGX2
Influenza A by PCR: NEGATIVE
Influenza B by PCR: NEGATIVE
Resp Syncytial Virus by PCR: NEGATIVE
SARS Coronavirus 2 by RT PCR: NEGATIVE

## 2022-07-19 MED ORDER — ESCITALOPRAM OXALATE 10 MG PO TABS
10.0000 mg | ORAL_TABLET | Freq: Every day | ORAL | Status: DC
  Administered 2022-07-20 – 2022-07-23 (×4): 10 mg via ORAL
  Filled 2022-07-19 (×7): qty 1

## 2022-07-19 NOTE — Group Note (Signed)
LCSW Group Therapy Note   Group Date: 07/19/2022 Start Time: 1100 End Time: 1200   Type of Therapy and Topic:  Group Therapy - Healthy vs Unhealthy Coping Skills  Participation Level:  Active   Description of Group The focus of this group was to determine what unhealthy coping techniques typically are used by group members and what healthy coping techniques would be helpful in coping with various problems. Patients were guided in becoming aware of the differences between healthy and unhealthy coping techniques. Patients were asked to identify 2-3 healthy coping skills they would like to learn to use more effectively.  Therapeutic Goals Patients learned that coping is what human beings do all day long to deal with various situations in their lives Patients defined and discussed healthy vs unhealthy coping techniques Patients identified their preferred coping techniques and identified whether these were healthy or unhealthy Patients determined 2-3 healthy coping skills they would like to become more familiar with and use more often. Patients provided support and ideas to each other   Summary of Patient Progress:  The Pt attended group and remained there the entire time.  The Pt accepted all worksheets and materials that were provided.  The Pt was appropriate with peers and shared their thoughts and feelings with the rest of the group.  The Pt was able to demonstrate understanding of the material being discussed and was able to find coping skills and strengths that they can use once discharged from the hospital setting.    Therapeutic Modalities Cognitive Behavioral Therapy Motivational Interviewing  Sherese Heyward M Sultana Tierney, LCSWA 07/19/2022  11:51 AM    

## 2022-07-19 NOTE — [facility] Group Notes (Signed)
10 out of 10 Goal: Speak with family member

## 2022-07-19 NOTE — Plan of Care (Signed)
Nurse discussed coping skills with patient.  

## 2022-07-19 NOTE — [facility] Suicide Risk Assessment (Signed)
[facility] INPATIENT:  Family/Significant Other Suicide Prevention Education  Suicide Prevention Education:  Education Completed; Lucilla Lame - [phone],  (name of family member/significant other) has been identified by the patient as the family member/significant other with whom the patient will be residing, and identified as the person(s) who will aid the patient in the event of a mental health crisis (suicidal ideations/suicide attempt).  With written consent from the patient, the family member/significant other has been provided the following suicide prevention education, prior to the and/or following the discharge of the patient.  CSW spoke with patient sister who reports that patient lives with him in the home.  She reports that a lot of the family lives together which can sometimes cause extra stress.  He has been helping a lot more with the kids which she believes adds to the stress and recently he obtained a charge which has made it harder to find a job.  She believes finances, his own relationship and the change from not being primary breadwinner are extra stressors that have caused suicidal statements and more depression.  Sister reports patient has never been physically violent but sometimes verbally abusive.  He drinks 6-8 beers a day which also can be contributing.  At this time the police has taken away firearm.  Sister aware that patient should not have access to gun if it is returned to their possession.  No additional concerns and patient can return to live with her and family  The suicide prevention education provided includes the following: Suicide risk factors Suicide prevention and interventions National Suicide Hotline telephone number [facility] assessment telephone number [facility] Emergency Assistance 911 [facility] and/or Residential Mobile Crisis Unit telephone number  Request made of family/significant other to: Remove weapons (e.g., guns, rifles,  knives), all items previously/currently identified as safety concern.   Remove drugs/medications (over-the-counter, prescriptions, illicit drugs), all items previously/currently identified as a safety concern.  The family member/significant other verbalizes understanding of the suicide prevention education information provided.  The family member/significant other agrees to remove the items of safety concern listed above.  Charlen Bakula E Tyson Parkison 07/19/2022, 2:21 PM

## 2022-07-19 NOTE — Progress Notes (Signed)
[facility]  07/19/2022 1:15 PM Bertram DenverOrevaoghene Barbe  MRN:  [id number] Subjective:   Bertram DenverOrevaoghene Iwata is a 31 yr old male who presented to [facility] ED on 12/9 under IVC for worsening depression and threats to "blow my brains out" with a gun, [facility] and his IVC was upheld.  PPHx is significant for MDD, Recurrent, Severe, PTSD, EtOH Use Disorder, Cocaine Use Disorder, and Cluster B Personality Disorder, and previous Residential Rehab ([facility]), and no history of Suicide Attempts, Self Injurious Behavior, or Psychiatric Hospitalizations.   Case was discussed in the multidisciplinary team. MAR was reviewed and patient was compliant with medications.  Patient last received as needed Haldol was on 12/10, he received as needed Atarax for anxiety MAR and trazodone for sleep 12/11.  On interview today patient reports he was admitted after he went to his girlfriend's sister's house "length about our relationship trouble" he notes he drank 2 bottles of liquor and after he left her house he went to his house back to his belongings and back to his guns: He describes as a collection guns with no ammos.  He reports he was surprised to find a police car was going to pick him up after his girlfriend told his sister that he was threatening to harm himself and her he denied suicidal ideations.  He continues to adamantly denies any passive or active SI intention or plan, denies HI or AVH.  He denies notes in the chart that he reported to other providers at [facility] having thoughts of harming himself and the possibility is he was still under the influence of alcohol at that time given his alcohol level was elevated when he presented there.  She does report having long history of depression "my mind is not right" he reports good sleep since admission and fair appetite and denies feeling depressed or anxious.  During evaluation patient presents circumstantial and tangential with  some paranoia especially toward his family members "my family do not like me" he notes since admission he had a phone call with his girlfriend but he is not planning to call her again "she denies any cheating and I am not calling her again".  Staff earlier during this hospitalization contacted patient's sister and obtained collateral information given she is a Engineer, civil (consulting)nurse and was started IVC paperwork.  Patient agreed to me to contact his girlfriend but refused to give me her phone number to "look it up" I asked him if he can offer names and phone number for other friends or family members he would like for us to call to get his side of the story confirmed, he agreed to obtain numbers from his phone and let staff know, will follow.  I encouraged patient strongly to do so to facilitate treatment and discharge planning. Patient does not display any signs of withdrawals, denies symptoms, CIWA score 2, 3 yesterday and this morning.  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 The direct patient care time included face-to-face time with the patient, reviewing the patient's chart, communicating with other professionals, and coordinating care. Greater than 50% of  this time was spent in counseling or coordinating care with the patient regarding goals of hospitalization, psycho-education, and discharge planning needs.   Past Psychiatric History: MDD, Recurrent, Severe, PTSD, EtOH Use Disorder, Cocaine Use Disorder, and Cluster B Personality Disorder, and previous Residential Rehab ([facility]), and no history of Suicide  Attempts, Self Injurious Behavior, or Psychiatric Hospitalizations.  Past Medical History:  Past Medical History:  Diagnosis Date   Bipolar 1 disorder (HCC)    Depression    Glaucoma    Kidney stones    Nephrolithiasis     Past Surgical History:  Procedure Laterality Date   KNEE CARTILAGE SURGERY Right 02/20/2020   Family History: History reviewed. No pertinent family history. Family Psychiatric  History: Mother- Schizophrenia Social History:  Social History   Substance and Sexual Activity  Alcohol Use Yes   Comment: social     Social History   Substance and Sexual Activity  Drug Use Yes   Frequency: 7.0 times per week   Types: Cocaine, Marijuana   Comment: last used cocaine last week    Social History   Socioeconomic History   Marital status: Single    Spouse name: Not on file   Number of children: Not on file   Years of education: Not on file   Highest education level: Not on file  Occupational History   Not on file  Tobacco Use   Smoking status: Every Day    Packs/day: 0.50    Types: Cigars, Cigarettes   Smokeless tobacco: Never  Vaping Use   Vaping Use: Never used  Substance and Sexual Activity   Alcohol use: Yes    Comment: social   Drug use: Yes    Frequency: 7.0 times per week    Types: Cocaine, Marijuana    Comment: last used cocaine last week   Sexual activity: Not on file    Comment: last cocaine use 02/24/14  Other Topics Concern   Not on file  Social History Narrative   Not on file   Social Determinants of Health   Financial Resource Strain: Not on file  Food Insecurity: Food Insecurity Present (07/16/2022)   Hunger Vital Sign    Worried About Running Out of Food in the Last Year: Often true    Ran Out of Food in the Last Year: Often true  Transportation Needs: Unmet Transportation Needs (07/16/2022)   PRAPARE - Administrator, Civil Service (Medical): Yes    Lack of Transportation (Non-Medical): No  Physical Activity: Not  on file  Stress: Not on file  Social Connections: Not on file   Additional Social History:                         Sleep: Fair  Appetite:  Good  Current Medications: Current Facility-Administered Medications  Medication Dose Route Frequency Provider Last Rate Last Admin   alum & mag hydroxide-simeth (MAALOX/MYLANTA) 200-200-20 MG/5ML suspension 30 mL  30 mL Oral Q4H PRN Clapacs, John T, MD       haloperidol (HALDOL) tablet 5 mg  5 mg Oral Q6H PRN Clapacs, Jackquline Denmark, MD   5 mg at 07/17/22 1920   And   benztropine (COGENTIN) tablet 1 mg  1 mg Oral Q6H PRN Clapacs, Jackquline Denmark, MD       escitalopram (LEXAPRO) tablet 5 mg  5 mg Oral Daily Lauro Franklin, MD   5 mg at 07/19/22 0745   folic acid (FOLVITE)  tablet 1 mg  1 mg Oral Daily Ntuen, Tina C, FNP   1 mg at 07/19/22 0746   hydrOXYzine (ATARAX) tablet 25 mg  25 mg Oral Q6H PRN Carlyn Reichert, MD   25 mg at 07/19/22 0749   ibuprofen (ADVIL) tablet 600 mg  600 mg Oral Q8H PRN Clapacs, Jackquline Denmark, MD       loperamide (IMODIUM) capsule 2-4 mg  2-4 mg Oral PRN Carlyn Reichert, MD       LORazepam (ATIVAN) tablet 1 mg  1 mg Oral Q6H PRN Carlyn Reichert, MD       magnesium hydroxide (MILK OF MAGNESIA) suspension 30 mL  30 mL Oral Daily PRN Clapacs, Jackquline Denmark, MD       multivitamin with minerals tablet 1 tablet  1 tablet Oral Daily Ntuen, Jesusita Oka, FNP   1 tablet at 07/19/22 0746   nicotine polacrilex (NICORETTE) gum 2 mg  2 mg Oral PRN Clapacs, Jackquline Denmark, MD   2 mg at 07/19/22 0748   ondansetron (ZOFRAN-ODT) disintegrating tablet 4 mg  4 mg Oral Q6H PRN Carlyn Reichert, MD       prazosin (MINIPRESS) capsule 1 mg  1 mg Oral QHS Carlyn Reichert, MD   1 mg at 07/18/22 2117   thiamine (Vitamin B-1) tablet 100 mg  100 mg Oral Daily Carlyn Reichert, MD   100 mg at 07/19/22 0746   traZODone (DESYREL) tablet 50 mg  50 mg Oral QHS PRN Cecilie Lowers, FNP   50 mg at 07/18/22 2117    Lab Results:  No results found for this or any previous visit (from the  past 48 hour(s)).   Blood Alcohol level:  Lab Results  Component Value Date   ETH 200 (H) 07/16/2022   ETH 147 (H) 02/06/2018    Metabolic Disorder Labs: Lab Results  Component Value Date   HGBA1C 4.7 (L) 07/17/2022   MPG 88 07/17/2022   No results found for: "PROLACTIN" Lab Results  Component Value Date   CHOL 127 07/17/2022   TRIG 74 07/17/2022   HDL 38 (L) 07/17/2022   CHOLHDL 3.3 07/17/2022   VLDL 15 07/17/2022   LDLCALC 74 07/17/2022    Physical Findings: AIMS:  , ,  ,  ,    CIWA:  CIWA-Ar Total: 3 COWS:     Musculoskeletal: Strength & Muscle Tone: within normal limits Gait & Station: normal Patient leans: N/A  Psychiatric Specialty Exam:  Presentation  General Appearance: Appropriate for Environment; Casual  Eye Contact:Fair  Speech:Clear and Coherent (mostly normal rate but occasionally rapid)  Speech Volume:Normal  Handedness:No data recorded  Mood and Affect  Mood:Anxious; Dysphoric Irritable Affect:Congruent   Thought Process  Thought Processes:-- (mostly coherent but occasionally disorganized) Circumstantial and occasionally tangential requiring a lot of redirection during evaluation. Descriptions of Associations:Intact  Orientation:Full (Time, Place and Person)  Thought Content:Logical; WDL  History of Schizophrenia/Schizoaffective disorder:No  Duration of Psychotic Symptoms:No data recorded Hallucinations:Hallucinations: None  Ideas of Reference:None  Suicidal Thoughts:Suicidal Thoughts: No  Homicidal Thoughts:Homicidal Thoughts: No   Sensorium  Memory:Immediate Good; Recent Good  Judgment: Poor, limited Insight: Poor, limited  Executive Functions  Concentration: Limited easily distracted, requiring redirection Attention Span: Limited Recall:Fair  Fund of Knowledge:Fair  Language:Fair   Psychomotor Activity  Psychomotor Activity:Psychomotor Activity: Normal   Assets  Assets:Communication Skills; Resilience;  Physical Health   Sleep  Sleep:Sleep: Fair    Physical Exam: Physical Exam Vitals and nursing note reviewed.  Constitutional:  General: He is not in acute distress.    Appearance: Normal appearance. He is normal weight. He is not ill-appearing or toxic-appearing.  HENT:     Head: Normocephalic and atraumatic.  Pulmonary:     Effort: Pulmonary effort is normal.  Musculoskeletal:        General: Normal range of motion.  Neurological:     General: No focal deficit present.     Mental Status: He is alert.    Review of Systems  Respiratory:  Negative for cough and shortness of breath.   Cardiovascular:  Negative for chest pain.  Gastrointestinal:  Negative for abdominal pain, constipation, diarrhea, nausea and vomiting.  Neurological:  Negative for dizziness, weakness and headaches.  Psychiatric/Behavioral:  Positive for depression. Negative for hallucinations and suicidal ideas. The patient is nervous/anxious.   All other systems reviewed and are negative.  Blood pressure (!) 148/87, pulse 74, temperature (!) 97.5 F (36.4 C), temperature source Oral, resp. rate 18, height 6\' 1"  (1.854 m), weight 59 kg, SpO2 100 %. Body mass index is 17.15 kg/m.   Treatment Plan Summary: Daily contact with patient to assess and evaluate symptoms and progress in treatment and Medication management  Emmons Narang is a 31 yr old male who presented to [facility] ED on 12/9 under IVC for worsening depression and threats to "blow my brains out" with a gun, he was admitted to [facility] on 12/10 and his IVC was upheld.  PPHx is significant for MDD, Recurrent, Severe, PTSD, EtOH Use Disorder, Cocaine Use Disorder, and Cluster B Personality Disorder, and previous Residential Rehab ([facility]), and no history of Suicide Attempts, Self Injurious Behavior, or Psychiatric Hospitalizations.   Damarie continues to blame others for things that have gone wrong in his life, however, he is more open to  treatment today.  He is willing to start Lexapro.  He did have a positive response to Prazosin so could consider further increases later.  He is still not willing to allow contact with others, however, he will need to allow contact with someone who can secure his guns prior to discharge.  We will continue to monitor.    MDD, Recurrent, Severe   PTSD: -Continue IVC -Titrate Lexapro from 5 to 10 mg daily for depression and anxiety Consider adding Abilify to augment antidepressant effect and to also help with mood stabilization and possible paranoia -Continue Prazosin 1 mg QHS for nightmares.   Cluster B Personality Disorder: -Encourage DBT Therapy at discharge  Continue to encourage to provide names and phone numbers for family/friends to contact for further collateral information and to discuss treatment/disposition plan.  Withdrawal: -Continue CIWA, last score= 1  @ 0600  12/11 -Continue Ativan 1 mg q6 PRN CIWA>10 -Continue Imodium 2-4 mg PRN diarrhea -Continue Zofran-ODT 4 mg q6 PRN nausea -Continue Bentyl 20 mg q6 PRN spasms -Continue Thiamine 100 mg daily for nutritional supplementation -Continue Multivitamin daily for nutritional supplementation   Nicotine Dependence: -Continue Nicotine gum 2 mg PRN   -Continue PRN's:  Advil, Maalox, Atarax, Milk of Magnesia, Trazodone   Shantae Vantol, MD 07/19/2022, 1:15 PM

## 2022-07-19 NOTE — Progress Notes (Signed)
D:  Patient denied SI and HI, contracts for safety.  Denied A/V hallucinations.  Denied pain. A:  Medications administered per MD orders.  Emotional support and encouragement given patient. R:  Safety maintained with 15 minute checks.  

## 2022-07-20 ENCOUNTER — Encounter (HOSPITAL_COMMUNITY): Payer: Self-pay | Admitting: Psychiatry

## 2022-07-20 MED ORDER — ARIPIPRAZOLE 5 MG PO TABS
5.0000 mg | ORAL_TABLET | Freq: Every day | ORAL | Status: DC
  Administered 2022-07-20 – 2022-07-23 (×4): 5 mg via ORAL
  Filled 2022-07-20 (×7): qty 1

## 2022-07-20 MED ORDER — HYDROXYZINE HCL 25 MG PO TABS
25.0000 mg | ORAL_TABLET | Freq: Four times a day (QID) | ORAL | Status: DC | PRN
  Administered 2022-07-20 – 2022-07-23 (×5): 25 mg via ORAL
  Filled 2022-07-20 (×3): qty 1
  Filled 2022-07-20: qty 10
  Filled 2022-07-20: qty 1

## 2022-07-20 MED ORDER — TRAZODONE HCL 50 MG PO TABS
50.0000 mg | ORAL_TABLET | Freq: Every day | ORAL | Status: DC
  Administered 2022-07-20 – 2022-07-22 (×3): 50 mg via ORAL
  Filled 2022-07-20 (×4): qty 1

## 2022-07-20 NOTE — Progress Notes (Signed)
[facility] MD Progress Note  07/20/2022 10:49 AM Marquize Seib  MRN:  [id number] Subjective:   Laurance Heide is a 31 yr old male who presented to [facility] ED on 12/9 under IVC for worsening depression and threats to "blow my brains out" with a gun, he was admitted to [facility] on 12/10 and his IVC was upheld.  PPHx is significant for MDD, Recurrent, Severe, PTSD, EtOH Use Disorder, Cocaine Use Disorder, and Cluster B Personality Disorder, and previous Residential Rehab ([facility]), and no history of Suicide Attempts, Self Injurious Behavior, or Psychiatric Hospitalizations.   Case was discussed in the multidisciplinary team. MAR was reviewed and patient was compliant with medications.  Patient last received as needed Haldol was on 12/10, he last received as needed Atarax for anxiety 12/12 and trazodone for sleep 12/11.  On interview today patient presents much calmer and more pleasant than yesterday much less irritable, reports did not sleep well last night but had trouble staying asleep received prazosin but did not receive trazodone last night, discussed changing trazodone to scheduled and will monitor.  He reports improving mood in general feeling less depressed and less anxious but continues to report occasionally feeling irritable and having mood swings.  He tells me he had a phone call with his girlfriend yesterday that went fairly well and he seems to be much less paranoid from his girlfriend and sister today and tells me that his girlfriend cares about him as well as his sister which is definitely a change since yesterday.  He continues to deny SI HI or AVH, denies paranoia or other delusions.  He denies side effect to medications and agrees to comply in the hospital and after discharge, discussed adding Abilify 5 mg daily to augment antidepressant effect and help with mood swings and easy irritability, patient agrees.  Patient reports anxiety improving with Atarax, will continue.  Patient is in  agreement to comply with outpatient follow-up appointments which is also quite an improvement since yesterday.  Patient does not display any signs of withdrawals.  Discussed with patient need to comply with recommendation to abstain completely from alcohol and illicit drugs including cocaine and marijuana after discharge, patient agrees.  Phone call with patient's girlfriend Cristy Folks at [phone] who reported phone call yesterday went very well and patient was apologetic, she describes patient yesterday to be "his normal self" she does agree this is a great improvement since admission.  She did confirm were taken away by police and after discharge patient will have no access to guns per her report.  Principal Problem: MDD (major depressive disorder), recurrent severe, without psychosis (HCC) Diagnosis: Principal Problem:   MDD (major depressive disorder), recurrent severe, without psychosis (HCC) Active Problems:   Alcohol use disorder   Cocaine use   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 [facility])   PTSD (post-traumatic stress disorder)  Total Time spent with patient:  I personally spent 35 minutes on the unit in direct patient care. The direct patient care time included face-to-face time with the patient, reviewing the patient's chart, communicating with other professionals, and coordinating care. Greater than 50% of this time was spent in counseling or coordinating care with the patient regarding goals of hospitalization, psycho-education, and discharge planning needs.   Past Psychiatric History: MDD, Recurrent, Severe, PTSD, EtOH Use Disorder, Cocaine Use Disorder, and Cluster B Personality Disorder, and previous Residential Rehab ([facility]), and no history of Suicide Attempts, Self Injurious Behavior, or Psychiatric Hospitalizations.  Past Medical History:  Past Medical  History:  Diagnosis Date   Bipolar 1 disorder (HCC)    Depression    Glaucoma    Kidney stones     Nephrolithiasis     Past Surgical History:  Procedure Laterality Date   KNEE CARTILAGE SURGERY Right 02/20/2020   Family History: History reviewed. No pertinent family history. Family Psychiatric  History: Mother- Schizophrenia Social History:  Social History   Substance and Sexual Activity  Alcohol Use Yes   Comment: social     Social History   Substance and Sexual Activity  Drug Use Yes   Frequency: 7.0 times per week   Types: Cocaine, Marijuana   Comment: last used cocaine last week    Social History   Socioeconomic History   Marital status: Single    Spouse name: Not on file   Number of children: Not on file   Years of education: Not on file   Highest education level: Not on file  Occupational History   Not on file  Tobacco Use   Smoking status: Every Day    Packs/day: 0.50    Types: Cigars, Cigarettes   Smokeless tobacco: Never  Vaping Use   Vaping Use: Never used  Substance and Sexual Activity   Alcohol use: Yes    Comment: social   Drug use: Yes    Frequency: 7.0 times per week    Types: Cocaine, Marijuana    Comment: last used cocaine last week   Sexual activity: Not on file    Comment: last cocaine use 02/24/14  Other Topics Concern   Not on file  Social History Narrative   Not on file   Social Determinants of Health   Financial Resource Strain: Not on file  Food Insecurity: Food Insecurity Present (07/16/2022)   Hunger Vital Sign    Worried About Running Out of Food in the Last Year: Often true    Ran Out of Food in the Last Year: Often true  Transportation Needs: Unmet Transportation Needs (07/16/2022)   PRAPARE - Administrator, Civil Service (Medical): Yes    Lack of Transportation (Non-Medical): No  Physical Activity: Not on file  Stress: Not on file  Social Connections: Not on file   Additional Social History:                         Sleep: Fair  Appetite:  Good  Current Medications: Current  Facility-Administered Medications  Medication Dose Route Frequency Provider Last Rate Last Admin   alum & mag hydroxide-simeth (MAALOX/MYLANTA) 200-200-20 MG/5ML suspension 30 mL  30 mL Oral Q4H PRN Clapacs, John T, MD       ARIPiprazole (ABILIFY) tablet 5 mg  5 mg Oral Daily Robson Trickey, MD       haloperidol (HALDOL) tablet 5 mg  5 mg Oral Q6H PRN Clapacs, John T, MD   5 mg at 07/17/22 1920   And   benztropine (COGENTIN) tablet 1 mg  1 mg Oral Q6H PRN Clapacs, Jackquline Denmark, MD       escitalopram (LEXAPRO) tablet 10 mg  10 mg Oral Daily Harith Mccadden, MD   10 mg at 07/20/22 1008   folic acid (FOLVITE) tablet 1 mg  1 mg Oral Daily Ntuen, Tina C, FNP   1 mg at 07/20/22 1013   hydrOXYzine (ATARAX) tablet 25 mg  25 mg Oral Q6H PRN Lizabeth Fellner, MD       ibuprofen (ADVIL) tablet 600 mg  600  mg Oral Q8H PRN Clapacs, Jackquline Denmark, MD       LORazepam (ATIVAN) tablet 1 mg  1 mg Oral Q6H PRN Carlyn Reichert, MD       magnesium hydroxide (MILK OF MAGNESIA) suspension 30 mL  30 mL Oral Daily PRN Clapacs, Jackquline Denmark, MD       multivitamin with minerals tablet 1 tablet  1 tablet Oral Daily Ntuen, Jesusita Oka, FNP   1 tablet at 07/20/22 1007   nicotine polacrilex (NICORETTE) gum 2 mg  2 mg Oral PRN Clapacs, Jackquline Denmark, MD   2 mg at 07/19/22 0748   prazosin (MINIPRESS) capsule 1 mg  1 mg Oral QHS Carlyn Reichert, MD   1 mg at 07/19/22 2122   thiamine (Vitamin B-1) tablet 100 mg  100 mg Oral Daily Carlyn Reichert, MD   100 mg at 07/20/22 1007   traZODone (DESYREL) tablet 50 mg  50 mg Oral QHS Lucero Ide, MD        Lab Results:  Results for orders placed or performed during the hospital encounter of 07/16/22 (from the past 48 hour(s))  Resp panel by RT-PCR (RSV, Flu A&B, Covid) Anterior Nasal Swab     Status: None   Collection Time: 07/19/22  4:15 PM   Specimen: Anterior Nasal Swab  Result Value Ref Range   SARS Coronavirus 2 by RT PCR NEGATIVE NEGATIVE    Comment: (NOTE) SARS-CoV-2 target nucleic acids are NOT  DETECTED.  The SARS-CoV-2 RNA is generally detectable in upper respiratory specimens during the acute phase of infection. The lowest concentration of SARS-CoV-2 viral copies this assay can detect is 138 copies/mL. A negative result does not preclude SARS-Cov-2 infection and should not be used as the sole basis for treatment or other patient management decisions. A negative result may occur with  improper specimen collection/handling, submission of specimen other than nasopharyngeal swab, presence of viral mutation(s) within the areas targeted by this assay, and inadequate number of viral copies(<138 copies/mL). A negative result must be combined with clinical observations, patient history, and epidemiological information. The expected result is Negative.  Fact Sheet for Patients:  BloggerCourse.com  Fact Sheet for Healthcare Providers:  SeriousBroker.it  This test is no t yet approved or cleared by the [place] FDA and  has been authorized for detection and/or diagnosis of SARS-CoV-2 by FDA under an Emergency Use Authorization (EUA). This EUA will remain  in effect (meaning this test can be used) for the duration of the COVID-19 declaration under Section 564(b)(1) of the Act, 21 U.S.C.section 360bbb-3(b)(1), unless the authorization is terminated  or revoked sooner.       Influenza A by PCR NEGATIVE NEGATIVE   Influenza B by PCR NEGATIVE NEGATIVE    Comment: (NOTE) The Xpert Xpress SARS-CoV-2/FLU/RSV plus assay is intended as an aid in the diagnosis of influenza from Nasopharyngeal swab specimens and should not be used as a sole basis for treatment. Nasal washings and aspirates are unacceptable for Xpert Xpress SARS-CoV-2/FLU/RSV testing.  Fact Sheet for Patients: BloggerCourse.com  Fact Sheet for Healthcare Providers: SeriousBroker.it  This test is not yet approved or  cleared by the [place] FDA and has been authorized for detection and/or diagnosis of SARS-CoV-2 by FDA under an Emergency Use Authorization (EUA). This EUA will remain in effect (meaning this test can be used) for the duration of the COVID-19 declaration under Section 564(b)(1) of the Act, 21 U.S.C. section 360bbb-3(b)(1), unless the authorization is terminated or revoked.  Resp Syncytial Virus by PCR NEGATIVE NEGATIVE    Comment: (NOTE) Fact Sheet for Patients: BloggerCourse.com  Fact Sheet for Healthcare Providers: SeriousBroker.it  This test is not yet approved or cleared by the [place] FDA and has been authorized for detection and/or diagnosis of SARS-CoV-2 by FDA under an Emergency Use Authorization (EUA). This EUA will remain in effect (meaning this test can be used) for the duration of the COVID-19 declaration under Section 564(b)(1) of the Act, 21 U.S.C. section 360bbb-3(b)(1), unless the authorization is terminated or revoked.  Performed at [facility], [address], [place], [place] [address]   Urinalysis, Complete w Microscopic Urine, Clean Catch     Status: Abnormal   Collection Time: 07/19/22  5:40 PM  Result Value Ref Range   Color, Urine YELLOW YELLOW   APPearance CLEAR CLEAR   Specific Gravity, Urine 1.008 1.005 - 1.030   pH 6.0 5.0 - 8.0   Glucose, UA NEGATIVE NEGATIVE mg/dL   Hgb urine dipstick NEGATIVE NEGATIVE   Bilirubin Urine NEGATIVE NEGATIVE   Ketones, ur NEGATIVE NEGATIVE mg/dL   Protein, ur NEGATIVE NEGATIVE mg/dL   Nitrite NEGATIVE NEGATIVE   Leukocytes,Ua TRACE (A) NEGATIVE   RBC / HPF 0-5 0 - 5 RBC/hpf   WBC, UA 6-10 0 - 5 WBC/hpf   Bacteria, UA NONE SEEN NONE SEEN   Squamous Epithelial / LPF 0-5 0 - 5   Mucus PRESENT     Comment: Performed at [facility], [address] [place], [place] [address]  Rapid urine drug screen (hospital  performed) not at [facility]     Status: Abnormal   Collection Time: 07/19/22  5:40 PM  Result Value Ref Range   Opiates NONE DETECTED NONE DETECTED   Cocaine NONE DETECTED NONE DETECTED   Benzodiazepines POSITIVE (A) NONE DETECTED   Amphetamines NONE DETECTED NONE DETECTED   Tetrahydrocannabinol POSITIVE (A) NONE DETECTED   Barbiturates NONE DETECTED NONE DETECTED    Comment: (NOTE) DRUG SCREEN FOR MEDICAL PURPOSES ONLY.  IF CONFIRMATION IS NEEDED FOR ANY PURPOSE, NOTIFY LAB WITHIN 5 DAYS.  LOWEST DETECTABLE LIMITS FOR URINE DRUG SCREEN Drug Class                     Cutoff (ng/mL) Amphetamine and metabolites    1000 Barbiturate and metabolites    200 Benzodiazepine                 200 Opiates and metabolites        300 Cocaine and metabolites        300 THC                            50 Performed at [facility], [address], [place], [place] [address]      Blood Alcohol level:  Lab Results  Component Value Date   ETH 200 (H) 07/16/2022   ETH 147 (H) 02/06/2018    Metabolic Disorder Labs: Lab Results  Component Value Date   HGBA1C 4.7 (L) 07/17/2022   MPG 88 07/17/2022   No results found for: "PROLACTIN" Lab Results  Component Value Date   CHOL 127 07/17/2022   TRIG 74 07/17/2022   HDL 38 (L) 07/17/2022   CHOLHDL 3.3 07/17/2022   VLDL 15 07/17/2022   LDLCALC 74 07/17/2022    Physical Findings: AIMS:  , ,  ,  ,    CIWA:  CIWA-Ar Total: 1 COWS:     Musculoskeletal: Strength & Muscle Tone: within normal limits Gait & Station: normal Patient leans: N/A  Psychiatric Specialty Exam:  Presentation  General Appearance: Appropriate for Environment; Casual  Eye Contact:Fair  Speech:Clear and Coherent (mostly normal rate but occasionally rapid)  Speech Volume:Normal  Handedness:No data recorded  Mood and Affect  Mood: Euthymic, pleasant and bright, no irritability noted Affect:Congruent   Thought Process  Thought Processes:  Linear and goal directed no circumstantiality or tangentiality noted   Descriptions of Associations:Intact  Orientation:Full (Time, Place and Person)  Thought Content:Logical; WDL  History of Schizophrenia/Schizoaffective disorder:No  Duration of Psychotic Symptoms:No data recorded Hallucinations:No data recorded  Ideas of Reference:None  Suicidal Thoughts:No data recorded  Homicidal Thoughts:No data recorded   Sensorium  Memory:Immediate Good; Recent Good  Judgment: Fair, improved Insight: Fair, improved  Executive Functions  Concentration: Improved, fair Attention Span: Improved, Fair Recall:Fair  Fund of Knowledge:Fair  Language:Fair   Psychomotor Activity  Psychomotor Activity:No data recorded   Assets  Assets:Communication Skills; Resilience; Physical Health   Sleep  Sleep:No data recorded    Physical Exam: Physical Exam Vitals and nursing note reviewed.  Constitutional:      General: He is not in acute distress.    Appearance: Normal appearance. He is normal weight. He is not ill-appearing or toxic-appearing.  HENT:     Head: Normocephalic and atraumatic.  Pulmonary:     Effort: Pulmonary effort is normal.  Musculoskeletal:        General: Normal range of motion.  Neurological:     General: No focal deficit present.     Mental Status: He is alert.    Review of Systems  Respiratory:  Negative for cough and shortness of breath.   Cardiovascular:  Negative for chest pain.  Gastrointestinal:  Negative for abdominal pain, constipation, diarrhea, nausea and vomiting.  Neurological:  Negative for dizziness, weakness and headaches.  Psychiatric/Behavioral:  Negative for hallucinations and suicidal ideas.   All other systems reviewed and are negative.  Blood pressure (!) 146/95, pulse 83, temperature 97.8 F (36.6 C), temperature source Oral, resp. rate 16, height 6\' 1"  (1.854 m), weight 59 kg, SpO2 99 %. Body mass index is 17.15  kg/m.   Treatment Plan Summary: Daily contact with patient to assess and evaluate symptoms and progress in treatment and Medication management  Victorious Darel HongSongster is a 31 yr old male who presented to [facility] ED on 12/9 under IVC for worsening depression and threats to "blow my brains out" with a gun, [facility] and his IVC was upheld.  PPHx is significant for MDD, Recurrent, Severe, PTSD, EtOH Use Disorder, Cocaine Use Disorder, and Cluster B Personality Disorder, and previous Residential Rehab ([facility]), and no history of Suicide Attempts, Self Injurious Behavior, or Psychiatric Hospitalizations.   Kimsey continues to blame others for things that have gone wrong in his life, however, he is more open to treatment today.  He is willing to start Lexapro.  He did have a positive response to Prazosin so could consider further increases later.  He is still not willing to allow contact with others, however, he will need to allow contact with someone who can secure his guns prior to discharge.  We will continue to monitor.    MDD, Recurrent, Severe   PTSD: -Continue IVC -Continue Lexapro 10 mg daily for depression and anxiety Add Abilify 5 mg daily to augment antidepressant effect and help with mood  stabilization and irritability. -Continue Prazosin 1 mg QHS for nightmares. Change trazodone 50 mg at bedtime from as needed to schedule and monitor sleep.   Cluster B Personality Disorder: -Encourage DBT Therapy at discharge  Continue to encourage to provide names and phone numbers for family/friends to contact for further collateral information and to discuss treatment/disposition plan.  Withdrawal: -Continue CIWA, last score= 1  @ 0600  12/11 -Continue Ativan 1 mg q6 PRN CIWA>10 -Continue Imodium 2-4 mg PRN diarrhea -Continue Zofran-ODT 4 mg q6 PRN nausea -Continue Bentyl 20 mg q6 PRN spasms -Continue Thiamine 100 mg daily for nutritional  supplementation -Continue Multivitamin daily for nutritional supplementation   Nicotine Dependence: -Continue Nicotine gum 2 mg PRN   -Continue PRN's:  Advil, Maalox, Atarax, Milk of Magnesia.   Laini Urick Abbott Pao, MD 07/20/2022, 10:49 AM

## 2022-07-20 NOTE — Progress Notes (Signed)
Pt rates depression 0/10 and anxiety 0/10. Pt reports a fair appetite, and no physical problems. Pt denies SI/HI/AVH and verbally contracts for safety. Provided support and encouragement. Pt safe on the unit. Q 15 minute safety checks continued.   

## 2022-07-20 NOTE — Progress Notes (Signed)
The patient attended the evening N.A.meeting.  

## 2022-07-20 NOTE — B[facility] Counselor (Signed)
[facility] LCSW Progress Note   07/20/2022    10:44 AM  Austin Owen   [phone]   Type of Contact and Topic:  Medicaid Expansion information  CSW provided information on how to apply for medicaid.  Patient provided opportunity to ask questions.     Signed:  Anson Oregon MSW, LCSW, LCAS 07/20/2022 10:44 AM

## 2022-07-20 NOTE — Progress Notes (Signed)
   07/20/22 1130  Psych Admission Type (Psych Patients Only)  Admission Status Involuntary  Psychosocial Assessment  Patient Complaints Anxiety  Eye Contact Brief  Facial Expression Anxious  Affect Anxious  Speech Logical/coherent  Interaction Assertive  Motor Activity Other (Comment) (WDL)  Appearance/Hygiene Unremarkable  Behavior Characteristics Appropriate to situation  Mood Anxious  Thought Process  Coherency WDL  Content WDL  Delusions WDL  Perception WDL  Hallucination None reported or observed  Judgment WDL  Confusion WDL  Danger to Self  Current suicidal ideation? Denies  Danger to Others  Danger to Others None reported or observed

## 2022-07-21 LAB — HIV ANTIBODY (ROUTINE TESTING W REFLEX): HIV Screen 4th Generation wRfx: NONREACTIVE

## 2022-07-21 NOTE — [facility] Group Notes (Signed)
Pt attended wrap up group 

## 2022-07-21 NOTE — Progress Notes (Signed)
  Administered PRN Hydroxyzine per MAR per pt request.  Pt BP 157/78; Administered scheduled Prazosin and Trazodone per MAR.

## 2022-07-21 NOTE — Progress Notes (Signed)
Patient calm and cooperative this shift, seems to be in an overall pleasant mood. Per self inventory sheet, pt slept well last night, has good concentration, reports he feels hyper. Pt rates his depression 0/10, hopelessness 0/10, and anxiety 3/10 but reports it is in a hyper way. Denies any physical problems today. Denies any pain. Denies SI/HI/AVH.

## 2022-07-21 NOTE — [facility] Group Notes (Signed)
Adult Psychoeducational Group Note  Date:  07/21/2022 Time:  3:00 PM  Group Topic/Focus:  Conflict Resolution:   The focus of this group is to discuss the conflict resolution process and how it may be used upon discharge. Healthy Communication:   The focus of this group is to discuss communication, barriers to communication, as well as healthy ways to communicate with others. Making Healthy Choices:   The focus of this group is to help patients identify negative/unhealthy choices they were using prior to admission and identify positive/healthier coping strategies to replace them upon discharge.  Participation Level:  Active  Participation Quality:  Appropriate and Attentive  Affect:  Appropriate  Cognitive:  Alert and Appropriate  Insight: Appropriate and Good  Engagement in Group:  Engaged and Supportive  Modes of Intervention:  Discussion and Exploration  Additional Comments:    Mansi Tokar R  07/21/2022, 3:00 PM 

## 2022-07-21 NOTE — Group Note (Signed)
LCSW Group Therapy Note   Group Date: 07/21/2022 Start Time: 1100 End Time: 1200  Type of Therapy and Topic: Group Therapy: Control  Participation Level: Active  Description of Group: In this group patients will discuss what is out of their control, what is somewhat in their control, and what is within their control.  They will be encouraged to explore what issues they can control and what issues are out of their control within their daily lives. They will be guided to discuss their thoughts, feelings, and behaviors related to these issues. The group will process together ways to better control things that are well within our own control and how to notice and accept the things that are not within our control. This group will be process-oriented, with patients participating in exploration of their own experiences as well as giving and receiving support and challenge from other group members.  During this group 4 worksheets will be provided to each patient to follow along and fill out.   Therapeutic Goals: 1. Patient will identify what is within their control and what is not within their control. 2. Patient will identify their thoughts and feelings about having control over their own lives. 3. Patient will identify their thoughts and feelings about not having control over everything in their lives.. 4. Patient will identify ways that they can have more control over their own lives. 5. Patient will identify areas were they can allow others to help them or provide assistance.  Summary of Patient Progress: The Pt attend group and remained there the entire time.  The Pt accepted all materials and worksheets that were provided.  The Pt was appropriate with their peers and was given an opportunity to share their thoughts and feelings with the rest of the group members.  The Pt was able to identify areas in their life where they have some or all control over their thoughts, behaviors, and feelings.    Aram Beecham, LCSWA 07/21/2022  1:59 PM

## 2022-07-21 NOTE — Progress Notes (Signed)
    07/20/22 2050  Psych Admission Type (Psych Patients Only)  Admission Status Involuntary  Psychosocial Assessment  Patient Complaints Anxiety  Eye Contact Brief  Facial Expression Anxious  Affect Anxious  Speech Logical/coherent  Interaction Assertive  Motor Activity Other (Comment) (WDL)  Appearance/Hygiene Unremarkable  Behavior Characteristics Appropriate to situation  Mood Anxious  Thought Process  Coherency WDL  Content WDL  Delusions None reported or observed  Perception WDL  Hallucination None reported or observed  Judgment WDL  Confusion WDL  Danger to Self  Current suicidal ideation? Denies  Agreement Not to Harm Self Yes  Description of Agreement verbal  Danger to Others  Danger to Others None reported or observed

## 2022-07-21 NOTE — Plan of Care (Signed)
  Problem: Activity: Goal: Interest or engagement in activities will improve Outcome: Progressing   Problem: Coping: Goal: Ability to verbalize frustrations and anger appropriately will improve Outcome: Progressing Goal: Ability to demonstrate self-control will improve Outcome: Progressing   

## 2022-07-21 NOTE — Progress Notes (Signed)
   07/21/22 0551  Sleep  Number of Hours 7.5

## 2022-07-21 NOTE — Plan of Care (Signed)
  Problem: Education: Goal: Knowledge of [facility] Education information/materials will improve Outcome: Progressing Goal: Emotional status will improve Outcome: Progressing Goal: Mental status will improve Outcome: Progressing Goal: Verbalization of understanding the information provided will improve Outcome: Progressing   Problem: Activity: Goal: Interest or engagement in activities will improve Outcome: Progressing Goal: Sleeping patterns will improve Outcome: Progressing   Problem: Coping: Goal: Ability to verbalize frustrations and anger appropriately will improve Outcome: Progressing Goal: Ability to demonstrate self-control will improve Outcome: Progressing   Problem: Health Behavior/Discharge Planning: Goal: Identification of resources available to assist in meeting health care needs will improve Outcome: Progressing Goal: Compliance with treatment plan for underlying cause of condition will improve Outcome: Progressing   Problem: Physical Regulation: Goal: Ability to maintain clinical measurements within normal limits will improve Outcome: Progressing   

## 2022-07-21 NOTE — Progress Notes (Addendum)
[facility] MD Progress Note  07/21/2022 10:39 AM Austin Owen  MRN:  [id number] Subjective:   Austin Owen is a 31 yr old male who presented to [facility] ED on 12/9 under IVC for worsening depression and threats to "blow my brains out" with a gun, he was admitted to [facility] on 12/10 and his IVC was upheld.  PPHx is significant for MDD, Recurrent, Severe, PTSD, EtOH Use Disorder, Cocaine Use Disorder, and Cluster B Personality Disorder, and previous Residential Rehab ([facility]), and no history of Suicide Attempts, Self Injurious Behavior, or Psychiatric Hospitalizations.   Case was discussed in the multidisciplinary team. MAR was reviewed and patient was compliant with medications.  Patient last received as needed Haldol was on 12/10, he uses 2 doses of Atarax as needed for anxiety yesterday and 1 dose earlier this morning with good efficacy reported.  On interview today patient presents calm pleasant and cheerful with fair eye contact and fluent speech, appears to be in good mood and denies feeling depressed but reports on and off anxiety, Atarax helpful describes it as "good anxiety" attending groups and interacting with the milieu with no negative reports noted.  Continues to deny SI HI or AVH does not present paranoid from family members or others which is quite an improvement also continues to be in agreement with outpatient follow-up with psychiatry and counseling as well as in agreement for IOP for drug use rehab after discharge.  He was counseled and he agreed to comply with recommendation to abstain completely from alcohol and illicit drugs after discharge including marijuana and cocaine.  He denies mood swings or easy irritability and denies side effect to current medication regimen, Abilify was added yesterday, in agreement to comply with medications after discharge.  Patient reports good sleep last night with the help of prazosin and trazodone, also reports fair to good  appetite. Yesterday patient reported girlfriend was diagnosed with trichomonas, he requested testing for STDs, Testing was sent for gonorrhea, chlamydia and HIV results pending.  As far as trichomonas patient denies any symptoms including penile secretions or discharge, was instructed to report to community health after discharge for testing if symptoms arise.  Patient does not display any signs of withdrawals, denies craving to alcohol or illicit drugs  Phone call with patient's girlfriend Cristy Folks at [phone] on 12/13 who reported phone call yesterday went very well and patient was apologetic, she describes patient yesterday to be "his normal self" she does agree this is a great improvement since admission.  She did confirm were taken away by police and after discharge patient will have no access to guns per her report.  Principal Problem: MDD (major depressive disorder), recurrent severe, without psychosis (HCC) Diagnosis: Principal Problem:   MDD (major depressive disorder), recurrent severe, without psychosis (HCC) Active Problems:   Alcohol use disorder   Cocaine use   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 [facility])   PTSD (post-traumatic stress disorder)  Total Time spent with patient:  I personally spent 35 minutes on the unit in direct patient care. The direct patient care time included face-to-face time with the patient, reviewing the patient's chart, communicating with other professionals, and coordinating care. Greater than 50% of this time was spent in counseling or coordinating care with the patient regarding goals of hospitalization, psycho-education, and discharge planning needs.   Past Psychiatric History: MDD, Recurrent, Severe, PTSD, EtOH Use Disorder, Cocaine Use Disorder, and Cluster B Personality Disorder, and previous Residential Rehab ([facility] [place]), and no history of  Suicide Attempts, Self Injurious Behavior, or Psychiatric Hospitalizations.  Past Medical  History:  Past Medical History:  Diagnosis Date   Bipolar 1 disorder (HCC)    Depression    Glaucoma    Kidney stones    Nephrolithiasis     Past Surgical History:  Procedure Laterality Date   KNEE CARTILAGE SURGERY Right 02/20/2020   Family History: History reviewed. No pertinent family history. Family Psychiatric  History: Mother- Schizophrenia Social History:  Social History   Substance and Sexual Activity  Alcohol Use Yes   Comment: social     Social History   Substance and Sexual Activity  Drug Use Yes   Frequency: 7.0 times per week   Types: Cocaine, Marijuana   Comment: last used cocaine last week    Social History   Socioeconomic History   Marital status: Single    Spouse name: Not on file   Number of children: Not on file   Years of education: Not on file   Highest education level: Not on file  Occupational History   Not on file  Tobacco Use   Smoking status: Every Day    Packs/day: 0.50    Types: Cigars, Cigarettes   Smokeless tobacco: Never  Vaping Use   Vaping Use: Never used  Substance and Sexual Activity   Alcohol use: Yes    Comment: social   Drug use: Yes    Frequency: 7.0 times per week    Types: Cocaine, Marijuana    Comment: last used cocaine last week   Sexual activity: Not on file    Comment: last cocaine use 02/24/14  Other Topics Concern   Not on file  Social History Narrative   Not on file   Social Determinants of Health   Financial Resource Strain: Not on file  Food Insecurity: Food Insecurity Present (07/16/2022)   Hunger Vital Sign    Worried About Running Out of Food in the Last Year: Often true    Ran Out of Food in the Last Year: Often true  Transportation Needs: Unmet Transportation Needs (07/16/2022)   PRAPARE - Administrator, Civil Service (Medical): Yes    Lack of Transportation (Non-Medical): No  Physical Activity: Not on file  Stress: Not on file  Social Connections: Not on file   Additional  Social History:                         Sleep: Fair  Appetite:  Good  Current Medications: Current Facility-Administered Medications  Medication Dose Route Frequency Provider Last Rate Last Admin   alum & mag hydroxide-simeth (MAALOX/MYLANTA) 200-200-20 MG/5ML suspension 30 mL  30 mL Oral Q4H PRN Clapacs, John T, MD       ARIPiprazole (ABILIFY) tablet 5 mg  5 mg Oral Daily Bonny Egger, MD   5 mg at 07/21/22 0752   haloperidol (HALDOL) tablet 5 mg  5 mg Oral Q6H PRN Clapacs, John T, MD   5 mg at 07/17/22 1920   And   benztropine (COGENTIN) tablet 1 mg  1 mg Oral Q6H PRN Clapacs, John T, MD       escitalopram (LEXAPRO) tablet 10 mg  10 mg Oral Daily Rayansh Herbst, MD   10 mg at 07/21/22 0753   folic acid (FOLVITE) tablet 1 mg  1 mg Oral Daily Ntuen, Tina C, FNP   1 mg at 07/21/22 0753   hydrOXYzine (ATARAX) tablet 25 mg  25 mg Oral  Q6H PRN Sarita BottomAttiah, Bless Belshe, MD   25 mg at 07/21/22 0753   ibuprofen (ADVIL) tablet 600 mg  600 mg Oral Q8H PRN Clapacs, Jackquline DenmarkJohn T, MD       LORazepam (ATIVAN) tablet 1 mg  1 mg Oral Q6H PRN Carlyn ReichertGabrielle, Nick, MD       magnesium hydroxide (MILK OF MAGNESIA) suspension 30 mL  30 mL Oral Daily PRN Clapacs, Jackquline DenmarkJohn T, MD       multivitamin with minerals tablet 1 tablet  1 tablet Oral Daily Ntuen, Jesusita Okaina C, FNP   1 tablet at 07/21/22 0753   nicotine polacrilex (NICORETTE) gum 2 mg  2 mg Oral PRN Clapacs, Jackquline DenmarkJohn T, MD   2 mg at 07/19/22 0748   prazosin (MINIPRESS) capsule 1 mg  1 mg Oral QHS Carlyn ReichertGabrielle, Nick, MD   1 mg at 07/20/22 2122   thiamine (Vitamin B-1) tablet 100 mg  100 mg Oral Daily Carlyn ReichertGabrielle, Nick, MD   100 mg at 07/21/22 [id number]   traZODone (DESYREL) tablet 50 mg  50 mg Oral QHS Abbott PaoAttiah, Manila Rommel, MD   50 mg at 07/20/22 2122    Lab Results:  Results for orders placed or performed during the hospital encounter of 07/16/22 (from the past 48 hour(s))  Resp panel by RT-PCR (RSV, Flu A&B, Covid) Anterior Nasal Swab     Status: None   Collection Time: 07/19/22  4:15 PM    Specimen: Anterior Nasal Swab  Result Value Ref Range   SARS Coronavirus 2 by RT PCR NEGATIVE NEGATIVE    Comment: (NOTE) SARS-CoV-2 target nucleic acids are NOT DETECTED.  The SARS-CoV-2 RNA is generally detectable in upper respiratory specimens during the acute phase of infection. The lowest concentration of SARS-CoV-2 viral copies this assay can detect is 138 copies/mL. A negative result does not preclude SARS-Cov-2 infection and should not be used as the sole basis for treatment or other patient management decisions. A negative result may occur with  improper specimen collection/handling, submission of specimen other than nasopharyngeal swab, presence of viral mutation(s) within the areas targeted by this assay, and inadequate number of viral copies(<138 copies/mL). A negative result must be combined with clinical observations, patient history, and epidemiological information. The expected result is Negative.  Fact Sheet for Patients:  BloggerCourse.comhttps://www.fda.gov/media/152166/download  Fact Sheet for Healthcare Providers:  SeriousBroker.ithttps://www.fda.gov/media/152162/download  This test is no t yet approved or cleared by the [place] FDA and  has been authorized for detection and/or diagnosis of SARS-CoV-2 by FDA under an Emergency Use Authorization (EUA). This EUA will remain  in effect (meaning this test can be used) for the duration of the COVID-19 declaration under Section 564(b)(1) of the Act, 21 U.S.C.section 360bbb-3(b)(1), unless the authorization is terminated  or revoked sooner.       Influenza A by PCR NEGATIVE NEGATIVE   Influenza B by PCR NEGATIVE NEGATIVE    Comment: (NOTE) The Xpert Xpress SARS-CoV-2/FLU/RSV plus assay is intended as an aid in the diagnosis of influenza from Nasopharyngeal swab specimens and should not be used as a sole basis for treatment. Nasal washings and aspirates are unacceptable for Xpert Xpress SARS-CoV-2/FLU/RSV testing.  Fact Sheet for  Patients: BloggerCourse.comhttps://www.fda.gov/media/152166/download  Fact Sheet for Healthcare Providers: SeriousBroker.ithttps://www.fda.gov/media/152162/download  This test is not yet approved or cleared by the [place] FDA and has been authorized for detection and/or diagnosis of SARS-CoV-2 by FDA under an Emergency Use Authorization (EUA). This EUA will remain in effect (meaning this test can be used) for the duration of  the COVID-19 declaration under Section 564(b)(1) of the Act, 21 U.S.C. section 360bbb-3(b)(1), unless the authorization is terminated or revoked.     Resp Syncytial Virus by PCR NEGATIVE NEGATIVE    Comment: (NOTE) Fact Sheet for Patients: BloggerCourse.com  Fact Sheet for Healthcare Providers: SeriousBroker.it  This test is not yet approved or cleared by the [place] FDA and has been authorized for detection and/or diagnosis of SARS-CoV-2 by FDA under an Emergency Use Authorization (EUA). This EUA will remain in effect (meaning this test can be used) for the duration of the COVID-19 declaration under Section 564(b)(1) of the Act, 21 U.S.C. section 360bbb-3(b)(1), unless the authorization is terminated or revoked.  Performed at [facility], [address], [place], [place] [address]   Urinalysis, Complete w Microscopic Urine, Clean Catch     Status: Abnormal   Collection Time: 07/19/22  5:40 PM  Result Value Ref Range   Color, Urine YELLOW YELLOW   APPearance CLEAR CLEAR   Specific Gravity, Urine 1.008 1.005 - 1.030   pH 6.0 5.0 - 8.0   Glucose, UA NEGATIVE NEGATIVE mg/dL   Hgb urine dipstick NEGATIVE NEGATIVE   Bilirubin Urine NEGATIVE NEGATIVE   Ketones, ur NEGATIVE NEGATIVE mg/dL   Protein, ur NEGATIVE NEGATIVE mg/dL   Nitrite NEGATIVE NEGATIVE   Leukocytes,Ua TRACE (A) NEGATIVE   RBC / HPF 0-5 0 - 5 RBC/hpf   WBC, UA 6-10 0 - 5 WBC/hpf   Bacteria, UA NONE SEEN NONE SEEN   Squamous Epithelial /  LPF 0-5 0 - 5   Mucus PRESENT     Comment: Performed at [facility], [address], [place], [place] [address]  Rapid urine drug screen (hospital performed) not at [facility]     Status: Abnormal   Collection Time: 07/19/22  5:40 PM  Result Value Ref Range   Opiates NONE DETECTED NONE DETECTED   Cocaine NONE DETECTED NONE DETECTED   Benzodiazepines POSITIVE (A) NONE DETECTED   Amphetamines NONE DETECTED NONE DETECTED   Tetrahydrocannabinol POSITIVE (A) NONE DETECTED   Barbiturates NONE DETECTED NONE DETECTED    Comment: (NOTE) DRUG SCREEN FOR MEDICAL PURPOSES ONLY.  IF CONFIRMATION IS NEEDED FOR ANY PURPOSE, NOTIFY LAB WITHIN 5 DAYS.  LOWEST DETECTABLE LIMITS FOR URINE DRUG SCREEN Drug Class                     Cutoff (ng/mL) Amphetamine and metabolites    1000 Barbiturate and metabolites    200 Benzodiazepine                 200 Opiates and metabolites        300 Cocaine and metabolites        300 THC                            50 Performed at [facility], [address] [place], [place] [address]      Blood Alcohol level:  Lab Results  Component Value Date   ETH 200 (H) 07/16/2022   ETH 147 (H) 02/06/2018    Metabolic Disorder Labs: Lab Results  Component Value Date   HGBA1C 4.7 (L) 07/17/2022   MPG 88 07/17/2022   No results found for: "PROLACTIN" Lab Results  Component Value Date   CHOL 127 07/17/2022   TRIG 74 07/17/2022   HDL 38 (L) 07/17/2022   CHOLHDL 3.3 07/17/2022   VLDL  15 07/17/2022   LDLCALC 74 07/17/2022    Physical Findings: AIMS:  , ,  ,  ,    CIWA:  CIWA-Ar Total: 1 COWS:     Musculoskeletal: Strength & Muscle Tone: within normal limits Gait & Station: normal Patient leans: N/A  Psychiatric Specialty Exam:  Presentation  General Appearance: Appropriate for Environment; Casual  Eye Contact:Fair  Speech:Clear and Coherent (mostly normal rate but occasionally rapid)  Speech  Volume:Normal  Handedness:No data recorded  Mood and Affect  Mood: Euthymic, pleasant and bright, no irritability noted Affect:Congruent   Thought Process  Thought Processes: Linear and goal directed no circumstantiality or tangentiality noted   Descriptions of Associations:Intact  Orientation:Full (Time, Place and Person)  Thought Content:Logical; WDL  History of Schizophrenia/Schizoaffective disorder:No  Duration of Psychotic Symptoms:No data recorded Hallucinations:No data recorded  Ideas of Reference:None  Suicidal Thoughts:No data recorded  Homicidal Thoughts:No data recorded   Sensorium  Memory:Immediate Good; Recent Good  Judgment: Fair, improved Insight: Fair, improved  Executive Functions  Concentration: Improved, fair Attention Span: Improved, Fair Recall:Fair  Fund of Knowledge:Fair  Language:Fair   Psychomotor Activity  Psychomotor Activity:No data recorded   Assets  Assets:Communication Skills; Resilience; Physical Health   Sleep  Sleep:No data recorded    Physical Exam: Physical Exam Vitals and nursing note reviewed.  Constitutional:      General: He is not in acute distress.    Appearance: Normal appearance. He is normal weight. He is not ill-appearing or toxic-appearing.  HENT:     Head: Normocephalic and atraumatic.  Pulmonary:     Effort: Pulmonary effort is normal.  Musculoskeletal:        General: Normal range of motion.  Neurological:     General: No focal deficit present.     Mental Status: He is alert.    Review of Systems  Constitutional:  Negative for chills and fever.  Eyes:  Negative for blurred vision.  Respiratory:  Negative for cough and shortness of breath.   Cardiovascular:  Negative for chest pain, palpitations and orthopnea.  Gastrointestinal:  Negative for abdominal pain, constipation, diarrhea, nausea and vomiting.  Genitourinary:  Negative for dysuria, frequency and urgency.  Neurological:   Negative for dizziness, weakness and headaches.  Psychiatric/Behavioral:  Negative for hallucinations and suicidal ideas.   All other systems reviewed and are negative.  Blood pressure 133/88, pulse 70, temperature 98.3 F (36.8 C), temperature source Oral, resp. rate 16, height  (1.854 m), weight 59 kg, SpO2 99 %. Body mass index is 17.15 kg/m.   Treatment Plan Summary: Daily contact with patient to assess and evaluate symptoms and progress in treatment and Medication management  Nazeer Blunck is a 31 yr old male who presented to [facility] ED on 12/9 under IVC for worsening depression and threats to "blow my brains out" with a gun, he was admitted to [facility] on 12/10 and his IVC was upheld.  PPHx is significant for MDD, Recurrent, Severe, PTSD, EtOH Use Disorder, Cocaine Use Disorder, and Cluster B Personality Disorder, and previous Residential Rehab ([facility]), and no history of Suicide Attempts, Self Injurious Behavior, or Psychiatric Hospitalizations.   MDD, Recurrent, Severe   PTSD: -Continue IVC -Continue Lexapro 10 mg daily for depression and anxiety Continue Abilify 5 mg daily to augment antidepressant effect and help with mood stabilization and irritability. -Continue Prazosin 1 mg QHS for nightmares. Continue trazodone 50 mg at bedtime scheduled, helping for sleep with no side effects reported.  Pending lab: Gonorrhea, chlamydia  and HIV testing.  Cluster B Personality Disorder: -Encourage DBT Therapy at discharge  Continue to encourage to provide names and phone numbers for family/friends to contact for further collateral information and to discuss treatment/disposition plan.  Withdrawal: -Continue CIWA, last score= 1  @ 0600  12/11 -Continue Ativan 1 mg q6 PRN CIWA>10 -Continue Imodium 2-4 mg PRN diarrhea -Continue Zofran-ODT 4 mg q6 PRN nausea -Continue Bentyl 20 mg q6 PRN spasms -Continue Thiamine 100 mg daily for nutritional supplementation -Continue  Multivitamin daily for nutritional supplementation   Nicotine Dependence: -Continue Nicotine gum 2 mg PRN   -Continue PRN's:  Advil, Maalox, Atarax, Milk of Magnesia.   Keshawn Fiorito Abbott Pao, MD 07/21/2022, 10:39 AM

## 2022-07-22 ENCOUNTER — Encounter (HOSPITAL_COMMUNITY): Payer: Self-pay

## 2022-07-22 LAB — SARS CORONAVIRUS 2 BY RT PCR: SARS Coronavirus 2 by RT PCR: NEGATIVE

## 2022-07-22 NOTE — BH IP Treatment Plan (Signed)
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 [facility])   PTSD (post-traumatic stress disorder)   Current Medications:  Current Facility-Administered Medications  Medication Dose Route Frequency Provider Last Rate Last Admin   alum & mag hydroxide-simeth (MAALOX/MYLANTA) 200-200-20 MG/5ML suspension 30 mL  30 mL Oral Q4H PRN Clapacs, John T, MD       ARIPiprazole (ABILIFY) tablet 5 mg  5 mg Oral Daily Attiah, Nadir, MD   5 mg at 07/22/22 0800   haloperidol (HALDOL) tablet 5 mg  5 mg Oral Q6H PRN Clapacs, Jackquline Denmark, MD   5 mg at 07/17/22 1920   And   benztropine (COGENTIN) tablet 1 mg  1 mg Oral Q6H PRN Clapacs, Jackquline Denmark, MD       escitalopram (LEXAPRO) tablet 10 mg  10 mg Oral Daily Attiah, Nadir, MD   10 mg at 07/22/22 0800   folic acid (FOLVITE) tablet 1 mg  1 mg Oral Daily Ntuen, Jesusita Oka, FNP   1 mg at 07/22/22 0801   hydrOXYzine (ATARAX) tablet 25 mg  25 mg Oral Q6H PRN Abbott Pao, Nadir, MD   25 mg at 07/21/22 0753   ibuprofen (ADVIL) tablet 600 mg  600 mg Oral Q8H PRN Clapacs, Jackquline Denmark, MD       LORazepam (ATIVAN) tablet 1 mg  1 mg Oral Q6H PRN Carlyn Reichert, MD       magnesium hydroxide (MILK OF MAGNESIA) suspension 30 mL  30 mL Oral Daily PRN Clapacs, Jackquline Denmark, MD       multivitamin with minerals tablet 1 tablet  1 tablet Oral Daily Ntuen, Jesusita Oka, FNP   1 tablet at 07/22/22 0801   nicotine polacrilex (NICORETTE) gum 2 mg  2 mg Oral PRN Clapacs, Jackquline Denmark, MD   2 mg at 07/19/22 0748   prazosin (MINIPRESS) capsule 1 mg  1 mg Oral QHS Carlyn Reichert, MD   1 mg at 07/21/22 2142   thiamine (Vitamin  B-1) tablet 100 mg  100 mg Oral Daily Carlyn Reichert, MD   100 mg at 07/22/22 0800   traZODone (DESYREL) tablet 50 mg  50 mg Oral QHS Abbott Pao, Nadir, MD   50 mg at 07/21/22 2142   PTA Medications: Medications Prior to Admission  Medication Sig Dispense Refill Last Dose   cyclobenzaprine (FLEXERIL) 5 MG tablet Take 1-2 tablets 3 times daily as needed (Patient not taking: Reported on 07/16/2022) 15 tablet 0    HYDROcodone-acetaminophen (NORCO) 5-325 MG tablet Take 1 tablet by mouth every 4 (four) hours as needed for moderate pain. (Patient not taking: Reported on 07/16/2022) 6 tablet 0    ibuprofen (ADVIL,MOTRIN) 600 MG tablet Take 1 tablet (600 mg total) by mouth every 6 (six) hours as needed. (Patient not taking: Reported on 07/16/2022) 30 tablet 0     Patient Stressors:    Patient Strengths:    Treatment Modalities: Medication Management, Group therapy, Case management,  1 to 1 session with clinician, Psychoeducation, Recreational therapy.   Physician Treatment Plan for Primary Diagnosis: MDD (major depressive disorder), recurrent severe, without psychosis (HCC) Long Term Goal(s): Improvement in symptoms so as ready for discharge  Short Term Goals: Ability to identify changes in lifestyle to reduce recurrence of condition will improve Ability to maintain clinical measurements within normal limits will improve Compliance with prescribed medications will improve Ability to identify triggers associated with substance abuse/mental health issues will improve Ability to verbalize feelings will improve Ability to disclose and discuss suicidal ideas  Medication Management: Evaluate patient's response, side effects, and tolerance of medication regimen.  Therapeutic Interventions: 1 to 1 sessions, Unit Group sessions and Medication administration.  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 [facility])   PTSD (post-traumatic stress disorder)  Long Term Goal(s): Improvement in symptoms so as ready for discharge   Short Term Goals: Ability to identify changes in lifestyle to reduce recurrence of condition will improve Ability to maintain clinical measurements within normal limits will improve Compliance with prescribed medications will improve Ability to identify triggers associated with substance abuse/mental health issues will improve Ability to verbalize feelings will improve Ability to disclose and discuss suicidal ideas     Medication Management: Evaluate patient's response, side effects, and tolerance of medication regimen.  Therapeutic Interventions: 1 to 1 sessions, Unit Group sessions and Medication administration.  Evaluation of Outcomes: Progressing   RN Treatment Plan for Primary Diagnosis: MDD (major depressive disorder), recurrent severe, without psychosis (HCC) Long Term Goal(s): Knowledge of disease and therapeutic regimen to maintain health will improve  Short Term Goals: Ability to remain free from injury will improve, Ability to verbalize frustration and anger appropriately will improve, Ability to participate in decision making will improve, Ability to verbalize feelings will improve, Ability to identify and develop effective coping behaviors will improve, and Compliance with prescribed medications will improve  Medication Management: RN will administer medications as ordered by provider, will assess and evaluate patient's response and provide education to patient for prescribed medication. RN will report any adverse and/or side effects to prescribing provider.  Therapeutic Interventions: 1 on 1 counseling sessions, Psychoeducation, Medication administration, Evaluate responses to treatment, Monitor vital signs and CBGs as  ordered, Perform/monitor CIWA, COWS, AIMS and Fall Risk screenings as ordered, Perform wound care treatments as ordered.  Evaluation of Outcomes: Progressing   LCSW Treatment Plan for Primary Diagnosis: MDD (major depressive disorder), recurrent severe, without psychosis (HCC) Long Term Goal(s): Safe transition to appropriate next level of care at discharge, Engage patient in therapeutic group addressing interpersonal concerns.  Short Term Goals: Engage patient in aftercare planning with referrals and resources, Increase social support, Increase ability to appropriately verbalize feelings, Increase emotional regulation, Facilitate acceptance of mental health diagnosis and concerns, Facilitate patient progression through stages of change regarding substance use diagnoses and concerns, and Increase skills for wellness and recovery  Therapeutic Interventions: Assess for all discharge needs, 1 to 1 time with Social worker, Explore available resources and support systems, Assess for adequacy in community support network, Educate family and significant other(s) on suicide prevention, Complete Psychosocial Assessment, Interpersonal group therapy.  Evaluation of Outcomes: Progressing  Progress in Treatment: Attending groups: Yes. Participating in groups: Yes. Taking medication as prescribed: Yes. Toleration medication: Yes. Family/Significant other contact made: Yes, sister Lucilla Lame - [phone] Patient understands diagnosis: No. Discussing patient identified problems/goals with staff: No. Medical problems stabilized or resolved: Yes. Denies suicidal/homicidal ideation: Yes. Issues/concerns per patient self-inventory: No.  New problem(s) identified: No, Describe:  none reported    New Short Term/Long Term Goal(s): detox, medication management for mood stabilization; elimination of SI thoughts; development of comprehensive mental wellness/sobriety plan       Patient Goals:  Pt  states, "I want to get out of here"   Discharge Plan or Barriers:  Patient recently admitted. CSW will continue to follow and assess for appropriate referrals and possible discharge planning.      Reason for Continuation of Hospitalization: Aggression Anxiety Depression Medication stabilization Suicidal ideation Withdrawal symptoms   Estimated Length of Stay: 1-3 days  Last 3 [place] Suicide Severity Risk Score: Flowsheet Row [facility] (Current[facility] Most recent reading at 07/16/2022  6:00 PM ED from 07/16/2022 in [facility] Most recent reading at 07/16/2022  5:44 AM  C-SSRS RISK CATEGORY High Risk High Risk       Last PHQ 2/9 Scores:     No data to display          Scribe for Treatment Team: Beather Arbour 07/22/2022 12:06 PM

## 2022-07-22 NOTE — Progress Notes (Signed)
   07/22/22 0800  Psych Admission Type (Psych Patients Only)  Admission Status Involuntary  Psychosocial Assessment  Patient Complaints Anxiety  Eye Contact Brief  Facial Expression Anxious  Affect Anxious  Speech Logical/coherent  Interaction Assertive  Motor Activity Other (Comment) (WDL)  Appearance/Hygiene Unremarkable  Behavior Characteristics Appropriate to situation  Mood Anxious  Thought Process  Coherency WDL  Content WDL  Delusions None reported or observed  Perception WDL  Hallucination None reported or observed  Judgment WDL  Confusion WDL  Danger to Self  Current suicidal ideation? Denies  Agreement Not to Harm Self Yes  Description of Agreement Verbal  Danger to Others  Danger to Others None reported or observed

## 2022-07-22 NOTE — Progress Notes (Addendum)
[facility]  07/22/2022 4:26 PM Austin Owen  MRN:  [id number]   Subjective:   Austin Owen is a 31 yr old male who presented to [facility] ED on 12/9 under IVC for worsening depression and threats to "blow my brains out" with a gun, [facility] and his IVC was upheld.  PPHx is significant for MDD, Recurrent, Severe, PTSD, EtOH Use Disorder, Cocaine Use Disorder, and Cluster B Personality Disorder, and previous [facility] Re[facility] ([facility]), and no history of Suicide Attempts, Self Injurious Behavior, or Psychiatric Hospitalizations.  Case was discussed in the multidisciplinary team. MAR was reviewed and patient was compliant with medications.  Patient last received as needed Haldol was on 12/10, he uses 2 doses of Atarax as needed for anxiety yesterday and 1 dose earlier this morning with good efficacy reported.  On interview today patient presents calm pleasant and cheerful with fair eye contact and fluent speech, appears to be in good mood and denies feeling depressed but reports on and off anxiety, Atarax helpful describes it as "good anxiety" attending groups and interacting with the milieu with no negative reports noted.  Continues to deny SI HI or AVH does not present paranoid from family members or others which is quite an improvement also continues to be in agreement with outpatient follow-up with psychiatry and counseling as well as in agreement for IOP for drug use rehab after discharge.  He was counseled and he agreed to comply with recommendation to abstain completely from alcohol and illicit drugs after discharge including marijuana and cocaine.  He denies mood swings or easy irritability and denies side effect to current medication regimen, Abilify was added yesterday, in agreement to comply with medications after discharge.  Patient reports good sleep last night with the help of prazosin and trazodone, also reports fair to good  appetite. Patient was informed of negative HIV test result and pending result for gonorrhea and chlamydia, will follow. Patient reports spoke with his sister and phone call went well, he plans to go to stay with his sister after discharge. He continues to be in agreement with outpatient follow-up with psychiatry for medication management For further therapy as well outpatient treatment for drug use rehab. Patient does not display any signs of withdrawals, denies craving to alcohol or illicit drugs  Phone call with patient's girlfriend Cristy FolksChristina Gardner at [phone] on 12/13 who reported phone call yesterday went very well and patient was apologetic, she describes patient yesterday to be "his normal self" she does agree this is a great improvement since admission.  She did confirm were taken away by police and after discharge patient will have no access to guns per her report.  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 The direct patient care time included face-to-face time with the patient, reviewing the patient's chart, communicating with other professionals, and coordinating care. Greater than 50% of this time was spent in counseling or coordinating care with the patient regarding goals of hospitalization, psycho-education, and discharge planning needs.   Past Psychiatric History: MDD, Recurrent, Severe, PTSD, EtOH Use Disorder, Cocaine Use Disorder, and Cluster  B Personality Disorder, and previous Residential Rehab ([facility]), and no history of Suicide Attempts, Self Injurious Behavior, or  Psychiatric Hospitalizations.  Past Medical History:  Past Medical History:  Diagnosis Date   Bipolar 1 disorder (HCC)    Depression    Glaucoma    Kidney stones    Nephrolithiasis     Past Surgical History:  Procedure Laterality Date   KNEE CARTILAGE SURGERY Right 02/20/2020   Family History: History reviewed. No pertinent family history. Family Psychiatric  History: Mother- Schizophrenia Social History:  Social History   Substance and Sexual Activity  Alcohol Use Yes   Comment: social     Social History   Substance and Sexual Activity  Drug Use Yes   Frequency: 7.0 times per week   Types: Cocaine, Marijuana   Comment: last used cocaine last week    Social History   Socioeconomic History   Marital status: Single    Spouse name: Not on file   Number of children: Not on file   Years of education: Not on file   Highest education level: Not on file  Occupational History   Not on file  Tobacco Use   Smoking status: Every Day    Packs/day: 0.50    Types: Cigars, Cigarettes   Smokeless tobacco: Never  Vaping Use   Vaping Use: Never used  Substance and Sexual Activity   Alcohol use: Yes    Comment: social   Drug use: Yes    Frequency: 7.0 times per week    Types: Cocaine, Marijuana    Comment: last used cocaine last week   Sexual activity: Not on file    Comment: last cocaine use 02/24/14  Other Topics Concern   Not on file  Social History Narrative   Not on file   Social Determinants of Health   Financial Resource Strain: Not on file  Food Insecurity: Food Insecurity Present (07/16/2022)   Hunger Vital Sign    Worried About Running Out of Food in the Last Year: Often true    Ran Out of Food in the Last Year: Often true  Transportation Needs: Unmet Transportation Needs (07/16/2022)   PRAPARE - Administrator, Civil Service (Medical): Yes    Lack of Transportation (Non-Medical): No  Physical Activity: Not on file  Stress: Not on file  Social  Connections: Not on file   Additional Social History:                         Sleep: Fair  Appetite:  Good  Current Medications: Current Facility-Administered Medications  Medication Dose Route Frequency Provider Last Rate Last Admin   alum & mag hydroxide-simeth (MAALOX/MYLANTA) 200-200-20 MG/5ML suspension 30 mL  30 mL Oral Q4H PRN Clapacs, John T, MD       ARIPiprazole (ABILIFY) tablet 5 mg  5 mg Oral Daily Mell Guia, MD   5 mg at 07/22/22 0800   haloperidol (HALDOL) tablet 5 mg  5 mg Oral Q6H PRN Clapacs, John T, MD   5 mg at 07/17/22 1920   And   benztropine (COGENTIN) tablet 1 mg  1 mg Oral Q6H PRN Clapacs, Jackquline Denmark, MD       escitalopram (LEXAPRO) tablet 10 mg  10 mg Oral Daily Sanaia Jasso, MD   10 mg at 07/22/22 0800   folic acid (FOLVITE) tablet 1 mg  1 mg Oral Daily Ntuen, Jesusita Oka, FNP   1 mg at  07/22/22 0801   hydrOXYzine (ATARAX) tablet 25 mg  25 mg Oral Q6H PRN Abbott Pao, Gianlucca Szymborski, MD   25 mg at 07/21/22 0753   ibuprofen (ADVIL) tablet 600 mg  600 mg Oral Q8H PRN Clapacs, Jackquline Denmark, MD       LORazepam (ATIVAN) tablet 1 mg  1 mg Oral Q6H PRN Carlyn Reichert, MD       magnesium hydroxide (MILK OF MAGNESIA) suspension 30 mL  30 mL Oral Daily PRN Clapacs, Jackquline Denmark, MD       multivitamin with minerals tablet 1 tablet  1 tablet Oral Daily Ntuen, Jesusita Oka, FNP   1 tablet at 07/22/22 0801   nicotine polacrilex (NICORETTE) gum 2 mg  2 mg Oral PRN Clapacs, Jackquline Denmark, MD   2 mg at 07/22/22 1304   prazosin (MINIPRESS) capsule 1 mg  1 mg Oral QHS Carlyn Reichert, MD   1 mg at 07/21/22 2142   thiamine (Vitamin B-1) tablet 100 mg  100 mg Oral Daily Carlyn Reichert, MD   100 mg at 07/22/22 0800   traZODone (DESYREL) tablet 50 mg  50 mg Oral QHS Abbott Pao, Jalana Moore, MD   50 mg at 07/21/22 2142    Lab Results:  Results for orders placed or performed during the hospital encounter of 07/16/22 (from the past 48 hour(s))  HIV Antibody (routine testing w rflx)     Status: None   Collection Time:  07/20/22  6:10 PM  Result Value Ref Range   HIV Screen 4th Generation wRfx Non Reactive Non Reactive    Comment: Performed at [facility], [address] [place], [place] [address]  SARS Coronavirus 2 by RT PCR (hospital order, performed in [facility] hospital lab) *cepheid single result test* Anterior Nasal Swab     Status: None   Collection Time: 07/22/22  6:00 AM   Specimen: Anterior Nasal Swab  Result Value Ref Range   SARS Coronavirus 2 by RT PCR NEGATIVE NEGATIVE    Comment: (NOTE) SARS-CoV-2 target nucleic acids are NOT DETECTED.  The SARS-CoV-2 RNA is generally detectable in upper and lower respiratory specimens during the acute phase of infection. The lowest concentration of SARS-CoV-2 viral copies this assay can detect is 250 copies / mL. A negative result does not preclude SARS-CoV-2 infection and should not be used as the sole basis for treatment or other patient management decisions.  A negative result may occur with improper specimen collection / handling, submission of specimen other than nasopharyngeal swab, presence of viral mutation(s) within the areas targeted by this assay, and inadequate number of viral copies (<250 copies / mL). A negative result must be combined with clinical observations, patient history, and epidemiological information.  Fact Sheet for Patients:   RoadLapTop.co.za  Fact Sheet for Healthcare Providers: http://kim-miller.com/  This test is not yet approved or  cleared by the [place] FDA and has been authorized for detection and/or diagnosis of SARS-CoV-2 by FDA under an Emergency Use Authorization (EUA).  This EUA will remain in effect (meaning this test can be used) for the duration of the COVID-19 declaration under Section 564(b)(1) of the Act, 21 U.S.C. section 360bbb-3(b)(1), unless the authorization is terminated or revoked sooner.  Performed at [facility],  [address], [place], [place] [address]      Blood Alcohol level:  Lab Results  Component Value Date   ETH 200 (H) 07/16/2022   ETH 147 (H) 02/06/2018    Metabolic Disorder Labs: Lab  Results  Component Value Date   HGBA1C 4.7 (L) 07/17/2022   MPG 88 07/17/2022   No results found for: "PROLACTIN" Lab Results  Component Value Date   CHOL 127 07/17/2022   TRIG 74 07/17/2022   HDL 38 (L) 07/17/2022   CHOLHDL 3.3 07/17/2022   VLDL 15 07/17/2022   LDLCALC 74 07/17/2022    Physical Findings: AIMS:  , ,  ,  ,    CIWA:  CIWA-Ar Total: 1 COWS:     Musculoskeletal: Strength & Muscle Tone: within normal limits Gait & Station: normal Patient leans: N/A  Psychiatric Specialty Exam:  Presentation  General Appearance: Appropriate for Environment; Casual  Eye Contact:Fair  Speech:Clear and Coherent (mostly normal rate but occasionally rapid)  Speech Volume:Normal  Handedness:No data recorded  Mood and Affect  Mood: Euthymic, pleasant and bright, no irritability noted Affect:Congruent   Thought Process  Thought Processes: Linear and goal directed no circumstantiality or tangentiality noted   Descriptions of Associations:Intact  Orientation:Full (Time, Place and Person)  Thought Content:Logical; WDL  History of Schizophrenia/Schizoaffective disorder:No  Duration of Psychotic Symptoms:No data recorded Hallucinations:No data recorded  Ideas of Reference:None  Suicidal Thoughts:No data recorded  Homicidal Thoughts:No data recorded   Sensorium  Memory:Immediate Good; Recent Good  Judgment: Fair, improved Insight: Fair, improved  Executive Functions  Concentration: Improved, fair Attention Span: Improved, Fair Recall:Fair  Fund of Knowledge:Fair  Language:Fair   Psychomotor Activity  Psychomotor Activity:No data recorded   Assets  Assets:Communication Skills; Resilience; Physical Health   Sleep  Sleep:No data recorded    Physical  Exam: Physical Exam Vitals and nursing note reviewed.  Constitutional:      General: He is not in acute distress.    Appearance: Normal appearance. He is normal weight. He is not ill-appearing or toxic-appearing.  HENT:     Head: Normocephalic and atraumatic.  Pulmonary:     Effort: Pulmonary effort is normal.  Musculoskeletal:        General: Normal range of motion.  Neurological:     General: No focal deficit present.     Mental Status: He is alert.    Review of Systems  Constitutional:  Negative for chills and fever.  Eyes:  Negative for blurred vision.  Respiratory:  Negative for cough and shortness of breath.   Cardiovascular:  Negative for chest pain, palpitations and orthopnea.  Gastrointestinal:  Negative for abdominal pain, constipation, diarrhea, nausea and vomiting.  Genitourinary:  Negative for dysuria, frequency and urgency.  Neurological:  Negative for dizziness, weakness and headaches.  Psychiatric/Behavioral:  Negative for hallucinations and suicidal ideas.   All other systems reviewed and are negative.  Blood pressure (!) 153/89, pulse 79, temperature 98.2 F (36.8 C), temperature source Oral, resp. rate 16, height 6\' 1"  (1.854 m), weight 59 kg, SpO2 98 %. Body mass index is 17.15 kg/m. Blood pressure 153/89  Treatment Plan Summary: Daily contact with patient to assess and evaluate symptoms and progress in treatment and Medication management  Yannick Lacock is a 31 yr old male who presented to [facility] ED on 12/9 under IVC for worsening depression and threats to "blow my brains out" with a gun, he was admitted to [facility] on 12/10 and his IVC was upheld.  PPHx is significant for MDD, Recurrent, Severe, PTSD, EtOH Use Disorder, Cocaine Use Disorder, and Cluster B Personality Disorder, and previous Residential Rehab ([facility]), and no history of Suicide Attempts, Self Injurious Behavior, or Psychiatric Hospitalizations.   MDD, Recurrent, Severe  PTSD: -Continue IVC -Continue Lexapro 10 mg daily for depression and anxiety Continue Abilify 5 mg daily to augment antidepressant effect and help with mood stabilization and irritability. -Continue Prazosin 1 mg QHS for nightmares. Continue trazodone 50 mg at bedtime scheduled, helping for sleep with no side effects reported.  HIV test negative Pending lab: Gonorrhea, chlamydia.  Cluster B Personality Disorder: -Encourage DBT Therapy at discharge  Continue to encourage to provide names and phone numbers for family/friends to contact for further collateral information and to discuss treatment/disposition plan.  Withdrawal: -Continue CIWA, last score= 1  @ 0600  12/11 -Continue Ativan 1 mg q6 PRN CIWA>10 -Continue Imodium 2-4 mg PRN diarrhea -Continue Zofran-ODT 4 mg q6 PRN nausea -Continue Bentyl 20 mg q6 PRN spasms -Continue Thiamine 100 mg daily for nutritional supplementation -Continue Multivitamin daily for nutritional supplementation   Nicotine Dependence: -Continue Nicotine gum 2 mg PRN   -Continue PRN's:  Advil, Maalox, Atarax, Milk of Magnesia.   Zerrick Hanssen Abbott Pao, MD 07/22/2022, 4:26 PM

## 2022-07-22 NOTE — Progress Notes (Signed)
   07/22/22 0000  Psych Admission Type (Psych Patients Only)  Admission Status Involuntary  Psychosocial Assessment  Patient Complaints Anxiety  Eye Contact Brief  Facial Expression Anxious  Affect Anxious  Speech Logical/coherent  Interaction Assertive  Motor Activity Other (Comment) (WDL)  Appearance/Hygiene Unremarkable  Behavior Characteristics Appropriate to situation  Mood Anxious  Thought Process  Coherency WDL  Content WDL  Delusions None reported or observed  Perception WDL  Hallucination None reported or observed  Judgment WDL  Confusion WDL  Danger to Self  Current suicidal ideation? Denies  Agreement Not to Harm Self Yes  Description of Agreement verbal  Danger to Others  Danger to Others None reported or observed

## 2022-07-22 NOTE — Plan of Care (Signed)
  Problem: Coping: Goal: Ability to verbalize frustrations and anger appropriately will improve Outcome: Progressing Goal: Ability to demonstrate self-control will improve Outcome: Progressing   

## 2022-07-22 NOTE — [facility] Group Notes (Signed)
[facility] Group Notes:  (Nursing)  Date:  07/22/2022  Time:  1300  Type of Therapy:  Psychoeducational Skills  Participation Level:  Active  Participation Quality:  Appropriate and Attentive  Affect:  Appropriate  Cognitive:  Alert and Appropriate  Insight:  Appropriate and Good  Engagement in Group:  Engaged  Modes of Intervention:  Discussion, Exploration, Rapport Building, Socialization, and Support  Summary of Progress/Problems:  Austin Owen 07/22/2022, 4:11 PM

## 2022-07-22 NOTE — Group Note (Signed)
Date:  07/22/2022 Time:  10:22 AM  Group Topic/Focus:  Goals Group:   The focus of this group is to help patients establish daily goals to achieve during treatment and discuss how the patient can incorporate goal setting into their daily lives to aide in recovery. Orientation:   The focus of this group is to educate the patient on the purpose and policies of crisis stabilization and provide a format to answer questions about their admission.  The group details unit policies and expectations of patients while admitted.    Participation Level:  Active  Participation Quality:  Appropriate  Affect:  Appropriate  Cognitive:  Appropriate  Insight: Appropriate  Engagement in Group:  Engaged  Modes of Intervention:  Discussion  Additional Comments:  Pt wants to stay positive and go to therapy for his kids.  Jaquita Rector 07/22/2022, 10:22 AM

## 2022-07-22 NOTE — [facility] Group Notes (Signed)
Pt attended AA Group.  

## 2022-07-23 DIAGNOSIS — F332 Major depressive disorder, recurrent severe without psychotic features: Secondary | ICD-10-CM

## 2022-07-23 MED ORDER — HYDROXYZINE HCL 25 MG PO TABS: 25.0000 mg | ORAL_TABLET | Freq: Four times a day (QID) | ORAL | 0 refills | Status: AC | PRN

## 2022-07-23 MED ORDER — ESCITALOPRAM OXALATE 10 MG PO TABS: 10.0000 mg | ORAL_TABLET | Freq: Every day | ORAL | 0 refills | Status: AC

## 2022-07-23 MED ORDER — PRAZOSIN HCL 1 MG PO CAPS: 1.0000 mg | ORAL_CAPSULE | Freq: Every day | ORAL | 0 refills | Status: AC

## 2022-07-23 MED ORDER — ARIPIPRAZOLE 5 MG PO TABS: 5.0000 mg | ORAL_TABLET | Freq: Every day | ORAL | 0 refills | Status: AC

## 2022-07-23 MED ORDER — NICOTINE POLACRILEX 2 MG MT GUM: 2.0000 mg | CHEWING_GUM | OROMUCOSAL | 0 refills | Status: AC | PRN

## 2022-07-23 MED ORDER — TRAZODONE HCL 50 MG PO TABS: 50.0000 mg | ORAL_TABLET | Freq: Every day | ORAL | 0 refills | Status: AC

## 2022-07-23 NOTE — Plan of Care (Signed)
  Problem: Activity: Goal: Interest or engagement in activities will improve Outcome: Progressing Goal: Sleeping patterns will improve Outcome: Progressing   Problem: Coping: Goal: Ability to verbalize frustrations and anger appropriately will improve Outcome: Progressing Goal: Ability to demonstrate self-control will improve Outcome: Progressing Patient compliant with treatment denies SI/HI/A/VH compliant with medications. Support and encouragement provided. Q 15 minutes safety checks ongoing.

## 2022-07-23 NOTE — [facility] Suicide Risk Assessment (Signed)
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  Involuntary commitment paperwork was filled out by his sister, Austin Owen, [phone].  The paperwork states that the patient made statements about "blowing my brains out" and "retrieved a shotgun with the intention of killing himself".  The paperwork reports a previous history of bipolar affective disorder.  He is admitted on an involuntary basis to the [facility] behavioral hospital.  His involuntary commitment was upheld (24 hour exam filled out by this provider).   PTA Medications:  Medications Prior to Admission  Medication Sig Dispense Refill Last Dose   cyclobenzaprine (FLEXERIL) 5 MG tablet Take 1-2 tablets 3 times daily as needed (Patient not taking: Reported on 07/16/2022) 15 tablet 0     HYDROcodone-acetaminophen (NORCO) 5-325 MG tablet Take 1 tablet by mouth every 4 (four) hours as needed for moderate pain. (Patient not taking: Reported on 07/16/2022) 6 tablet 0     ibuprofen (ADVIL,MOTRIN) 600 MG tablet Take 1 tablet (600 mg total) by mouth every 6 (six) hours as needed. (Patient not taking: Reported on 07/16/2022)       Hospital Course:   During the patient's hospitalization, patient had extensive initial psychiatric evaluation, and follow-up psychiatric evaluations every day.  Psychiatric diagnoses provided upon initial assessment: MDD recurrent severe, PTSD, alcohol and cocaine use disorder  Patient's psychiatric  medications were adjusted on admission: started prazosin for nightmares otherwise patient declines other meds to try to help with mood  During the hospitalization, other adjustments were made to the patient's psychiatric medication regimen: Lexapro was added and titrated to 10 mg daily for depression, abilify was added 5 mg daily for mood and possible paranoia, trazodone 50 mg at bedtime was added for sleep, prazosin 1 mg at bedtime was cont for nightmares, al;so was on vistaril 25 mg prn for anxiety with good efficacy and safety reported.  Patient's care was discussed during the interdisciplinary team meeting every day during the hospitalization.  The patient denied having side effects to prescribed psychiatric medication.  Gradually, patient started adjusting to milieu. The patient was evaluated each day by a clinical provider to ascertain response to treatment. Improvement was noted by the patient's report of decreasing symptoms, improved sleep and appetite, affect, medication tolerance, behavior, and participation in unit programming.  Patient was asked each day to complete a self inventory noting mood, mental status, pain, new symptoms, anxiety and concerns.    Symptoms were reported as significantly decreased or resolved completely by discharge.   On day of discharge, patient was evaluated on 07/23/2022 the patient reports that their mood is stable. The patient denied having suicidal thoughts for more than 48 hours prior to discharge.  Patient denies having homicidal thoughts.  Patient denies having auditory hallucinations.  Patient denies any visual hallucinations or other symptoms of psychosis. The patient was motivated to continue taking medication with a goal of continued improvement in mental health.  Patient noted to have god insight to negative effect of alcohol and cocaine use on his mental and physical health,  agrees to abstain completely after dc,also agreed to comply with meds and FU  appts including IOP appts for substance use d/o.  The patient reports their target psychiatric symptoms of depression, irritability and agitation responded well to the psychiatric medications, and the patient reports overall benefit other psychiatric hospitalization. Supportive psychotherapy was provided to the patient. The patient also participated in regular group therapy while hospitalized. Coping skills, problem solving as well as relaxation therapies were also part of the unit programming.  Labs were reviewed with the patient, and abnormal results were discussed with the patient.  The patient is able to verbalize their individual safety plan to this provider. During hospital stay patient was noted to have multiple elevated bl pr readings he was given occasionally prn clonidine with good effect, at time of dc he was counseled regard healthy cardiac diet and need to see his pcp within 7 days after dc for further management recommendations, he agreed, he denied h/o HTN diagnosed previously.  Behavioral Events: none  Restraints: none  Groups:attended and participated  Medications Changes: as above  D/C Medications: as above  Sleep  Sleep: good, improved during hospital stay  Musculoskeletal: Strength & Muscle Tone: within normal limits Gait & Station: normal Patient leans: N/A  Psychiatric Specialty Exam  General Appearance: appears at stated age, fairly dressed and groomed  Behavior: pleasant and cooperative  Psychomotor Activity:No psychomotor agitation or retardation noted   Eye Contact: good Speech: normal amount, tone, volume and latency   Mood: euthymic Affect: congruent, pleasant and interactive  Thought Process: linear, goal directed, no circumstantial or tangential thought process noted, no racing thoughts or flight of ideas Descriptions of Associations: intact Thought Content: Hallucinations: denies AH, VH , does not appear responding to stimuli Delusions: No  paranoia or other delusions noted Suicidal Thoughts: denies SI, intention, plan  Homicidal Thoughts: denies HI, intention, plan   Alertness/Orientation: alert and fully oriented  Insight: fair, improved Judgment: fair, improved  Memory: intact  Executive Functions  Concentration: intact  Attention Span: Fair Recall: intact Fund of Knowledge: fair   Art therapist  Concentration: intact Attention Span: Fair Recall: intact Fund of Knowledge: fair   Assets  Assets: Manufacturing systems engineer; Resilience; Physical Health   Physical Exam: Physical Exam ROS Blood pressure (!) 145/99, pulse 99, temperature 97.8 F (36.6 C), temperature source Oral, resp. rate 18, height 6\' 1"  (1.854 m), weight 59 kg, SpO2 97 %. Body mass index is 17.15 kg/m.  Mental Status Per Nursing Assessment::   On Admission:  Suicidal ideation indicated by patient  Demographic Factors:  Male and Unemployed  Loss Factors: NA  Historical Factors: Impulsivity  Risk Reduction Factors:   Positive social support and Positive therapeutic relationship  Continued Clinical Symptoms: improved significantly during hospital stay Depression:   Anhedonia Hopelessness Impulsivity Insomnia  Cognitive Features That Contribute To Risk:  None    Suicide Risk:  Minimal: No identifiable suicidal ideation.  Patients presenting with no risk factors but with morbid ruminations; may be classified as minimal risk based on the severity of the depressive symptoms   Follow-up Information     [id number] Go on 07/25/2022.   Why: You have a hospital follow up appointment on 07/25/22 at 10:00 am. The appointment will be held in person.  Following this initial appointment, you will be scheduled for a clinical assessment to obtain  necessary therapy and medication management services. Please let them know you are interested in additional substance use services.  They will  connect you with the [facility]  program. Contact information: [address] [place] [address] [phone]         [facility] Follow up.   Why: If you cannot get an appointment with your primary care physician, you can walk in to this clinic for medical care. Contact information: [address] [place] [address] [phone]                Plan Of Care/Follow-up recommendations:    Discharge recommendations:     Activity: as tolerated  Diet: heart healthy  # It is recommended to the patient to continue psychiatric medications as prescribed, after discharge from the hospital.     # It is recommended to the patient to follow up with your outpatient psychiatric provider and PCP.   # It was discussed with the patient, the impact of alcohol, drugs, tobacco have been there overall psychiatric and medical wellbeing, and total abstinence from substance use was recommended the patient.ed.   # Prescriptions provided or sent directly to preferred pharmacy at discharge. Patient agreeable to plan. Given opportunity to ask questions. Appears to feel comfortable with discharge.    # In the event of worsening symptoms, the patient is instructed to call the crisis hotline, 911 and or go to the nearest ED for appropriate evaluation and treatment of symptoms. To follow-up with primary care provider for other medical issues, concerns and or health care needs   # Patient was discharged home with a plan to follow up as noted above.  -Follow-up with outpatient primary care doctor and other specialists -for management of chronic medical disease, including: patient was instructed to contact and see his pcp within 7 days of dc to address elevated bl pr, he was also given resources for walk in community health clinic.   Patient agrees with D/C instructions and plan.  The patient received suicide prevention pamphlet:  Yes Belongings returned:   Clothing and Valuables  Total Time Spent in Direct Patient Care:  I personally spent 45 minutes on the unit in direct patient care. The direct patient care time included face-to-face time with the patient, reviewing the patient's chart, communicating with other professionals, and coordinating care. Greater than 50% of this time was spent in counseling or coordinating care with the patient regarding goals of hospitalization, psycho-education, and discharge planning needs.   Austin Owen 07/23/2022, 9:34 AM   Austin Simmering Abbott Pao, MD 07/23/2022, 9:34 AM

## 2022-07-23 NOTE — Discharge Summary (Signed)
Physician Discharge Summary Note  Patient:  Austin Owen is an 31 y.o., male MRN:  [id number] DOB:  [birth year] Patient phone:  [phone] (home)  Patient address:   [address] [place] [address],  Total Time spent with patient: 45 minutes  Date of Admission:  07/16/2022 Date of Discharge: 07/23/2022  Reason for Admission:  Austin Owen is a 31 year old male with no past psychiatric history found in the medical record.  Involuntary commitment paperwork was filled out by his sister, Cristo Ausburn, [phone].  The paperwork states that the patient made statements about "blowing my brains out" and "retrieved a shotgun with the intention of killing himself".  The paperwork reports a previous history of bipolar affective disorder.  He is admitted on an involuntary basis to the [facility].  His involuntary commitment was upheld (24 hour exam filled out by this provider).   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 No pertinent family history. Family Psychiatric  History:  None noted or reported Social History:  Social History   Substance and Sexual Activity  Alcohol Use Yes   Comment: social     Social History   Substance and Sexual Activity  Drug Use Yes   Frequency: 7.0 times per  week   Types: Cocaine, Marijuana   Comment: last used cocaine last week    Social History   Socioeconomic History   Marital status: Single    Spouse name: Not on file   Number of children: Not on file   Years of education: Not on file   Highest education level: Not on file  Occupational History   Not on file  Tobacco Use   Smoking status: Every Day    Packs/day: 0.50    Types: Cigars, Cigarettes   Smokeless tobacco: Never  Vaping Use   Vaping Use: Never used  Substance and Sexual Activity   Alcohol use: Yes    Comment: social   Drug use: Yes    Frequency: 7.0 times per week    Types: Cocaine, Marijuana    Comment: last used cocaine last week   Sexual activity: Not on file    Comment: last cocaine use 02/24/14  Other Topics Concern   Not on file  Social History Narrative   Not on file   Social Determinants of Health   Financial Resource Strain: Not on file  Food Insecurity: Food Insecurity Present (07/16/2022)   Hunger Vital Sign    Worried About Running Out of Food in the Last Year: Often true    Ran Out of Food in the Last Year: Often true  Transportation Needs: Unmet Transportation Needs (07/16/2022)   PRAPARE - Administrator, Civil Service (Medical): Yes    Lack of Transportation (Non-Medical): No  Physical Activity: Not on file  Stress: Not on  file  Social Connections: Not on file   He has resided at the house of his sister for the past 2 years, living with her and his girlfriend of the past 11 years with whom he has raised 3 children, and who he states are of uncertain paternity.  The patient is originally from [place] and immigrated to the [place] at the age of 31 years old.  He reports that he came with his mother and his father stayed in [place].  He reports that his mother was possessed by a demon during an exorcism and developed schizophrenia.  He says that she went back to [place] eventually.  When asked  who he was raised by he says "myself".  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   Substance Use History:  The patient reports frequent marijuana and cocaine use.  He states that he uses cocaine to help manage pain from kidney stones.  He reports drinking alcohol "not much, and when pressed says "2 times per week".  He denies alcohol interfering with his daily function and he states that he has never experienced alcohol withdrawal symptoms.   Hospital Course:   During the patient's hospitalization, patient had extensive initial psychiatric evaluation, and follow-up psychiatric evaluations every day.   Psychiatric diagnoses provided upon initial assessment: MDD recurrent severe, PTSD, alcohol and cocaine use disorder   Patient's psychiatric medications were adjusted on admission: started prazosin for nightmares otherwise patient declines other meds to try to help with mood   During the hospitalization, other adjustments were made to the patient's psychiatric medication regimen: Lexapro was added and titrated to 10 mg daily for depression, abilify was added 5 mg daily for mood and possible paranoia, trazodone 50 mg at bedtime was added for sleep, prazosin 1 mg at bedtime was cont for nightmares, al;so was on vistaril 25 mg prn for anxiety with good efficacy and safety reported.   Patient's care was discussed during the interdisciplinary team meeting every day during the hospitalization.   The patient denied having side effects to prescribed psychiatric medication.   Gradually, patient started adjusting to milieu. The patient was evaluated each day by a clinical provider to ascertain response to treatment. Improvement was noted by the patient's report of decreasing symptoms, improved sleep and appetite, affect, medication tolerance, behavior, and participation in unit programming.  Patient was asked each day to complete a self inventory noting mood,  mental status, pain, new symptoms, anxiety and concerns.     Symptoms were reported as significantly decreased or resolved completely by discharge.    On day of discharge, patient was evaluated on 07/23/2022 the patient reports that their mood is stable. The patient denied having suicidal thoughts for more than 48 hours prior to discharge.  Patient denies having homicidal thoughts.  Patient denies having auditory hallucinations.  Patient denies any visual hallucinations or other symptoms of psychosis. The patient was motivated to continue taking medication with a goal of continued improvement in mental health.  Patient noted to have god insight to negative effect of alcohol and cocaine use on his mental and physical health, agrees to abstain completely after dc,also agreed to comply with meds and FU appts including IOP appts for substance use d/o.   The patient reports their target psychiatric symptoms of depression, irritability and agitation responded well to the psychiatric medications, and the patient reports overall benefit other psychiatric hospitalization. Supportive  psychotherapy was provided to the patient. The patient also participated in regular group therapy while hospitalized. Coping skills, problem solving as well as relaxation therapies were also part of the unit programming.   Labs were reviewed with the patient, and abnormal results were discussed with the patient.   The patient is able to verbalize their individual safety plan to this provider. During hospital stay patient was noted to have multiple elevated bl pr readings he was given occasionally prn clonidine with good effect, at time of dc he was counseled regard healthy cardiac diet and need to see his pcp within 7 days after dc for further management recommendations, he agreed, he denied h/o HTN diagnosed previously.   Behavioral Events: none   Restraints: none   Groups:attended and participated   Medications Changes: as  above   D/C Medications: as above   Sleep  Sleep: good, improved during hospital stay  Physical Findings: AIMS: Facial and Oral Movements Muscles of Facial Expression: None, normal Lips and Perioral Area: None, normal Jaw: None, normal Tongue: None, normal,Extremity Movements Upper (arms, wrists, hands, fingers): None, normal Lower (legs, knees, ankles, toes): None, normal, Trunk Movements Neck, shoulders, hips: None, normal, Overall Severity Severity of abnormal movements (highest score from questions above): None, normal Incapacitation due to abnormal movements: None, normal Patient's awareness of abnormal movements (rate only patient's report): No Awareness, Dental Status Current problems with teeth and/or dentures?: No Does patient usually wear dentures?: No  CIWA:  CIWA-Ar Total: 0 COWS:     Musculoskeletal: Strength & Muscle Tone: within normal limits Gait & Station: normal Patient leans: N/A   Psychiatric Specialty Exam:  General Appearance: appears at stated age, fairly dressed and groomed  Behavior: pleasant and cooperative  Psychomotor Activity:No psychomotor agitation or retardation noted   Eye Contact: good Speech: normal amount, tone, volume and latency   Mood: euthymic Affect: congruent, pleasant and interactive  Thought Process: linear, goal directed, no circumstantial or tangential thought process noted, no racing thoughts or flight of ideas Descriptions of Associations: intact Thought Content: Hallucinations: denies AH, VH , does not appear responding to stimuli Delusions: No paranoia or other delusions noted Suicidal Thoughts: denies SI, intention, plan  Homicidal Thoughts: denies HI, intention, plan   Alertness/Orientation: alert and fully oriented  Insight: fair, improved Judgment: fair, improved  Memory: intact  Executive Functions  Concentration: intact  Attention Span: Fair Recall: intact Fund of Knowledge: fair   Assets   Assets: Manufacturing systems engineerCommunication Skills; Resilience; Physical Health     Physical Exam:  Physical Exam Vitals and nursing note reviewed.  Constitutional:      Appearance: Normal appearance.  HENT:     Head: Normocephalic and atraumatic.     Nose: Nose normal.  Eyes:     Pupils: Pupils are equal, round, and reactive to light.  Pulmonary:     Effort: Pulmonary effort is normal.  Musculoskeletal:        General: Normal range of motion.  Neurological:     General: No focal deficit present.     Mental Status: He is alert and oriented to person, place, and time.  Psychiatric:        Mood and Affect: Mood normal.        Behavior: Behavior normal.        Thought Content: Thought content normal.        Judgment: Judgment normal.    Review of Systems  All other systems reviewed and are negative.  Blood pressure (!) 140/88,  pulse 95, temperature 97.8 F (36.6 C), temperature source Oral, resp. rate 18, height 6\' 1"  (1.854 m), weight 59 kg, SpO2 98 %. Body mass index is 17.15 kg/m.   Social History   Tobacco Use  Smoking Status Every Day   Packs/day: 0.50   Types: Cigars, Cigarettes  Smokeless Tobacco Never   Tobacco Cessation:  A prescription for an FDA-approved tobacco cessation medication provided at discharge   Blood Alcohol level:  Lab Results  Component Value Date   ETH 200 (H) 07/16/2022   ETH 147 (H) 02/06/2018    Metabolic Disorder Labs:  Lab Results  Component Value Date   HGBA1C 4.7 (L) 07/17/2022   MPG 88 07/17/2022   No results found for: "PROLACTIN" Lab Results  Component Value Date   CHOL 127 07/17/2022   TRIG 74 07/17/2022   HDL 38 (L) 07/17/2022   CHOLHDL 3.3 07/17/2022   VLDL 15 07/17/2022   LDLCALC 74 07/17/2022    See Psychiatric Specialty Exam and Suicide Risk Assessment completed by Attending Physician prior to discharge.  Discharge destination:  Home  Is patient on multiple antipsychotic therapies at discharge:  No   Has Patient had  three or more failed trials of antipsychotic monotherapy by history:  No  Recommended Plan for Multiple Antipsychotic Therapies: NA  Discharge Instructions     Diet - low sodium heart healthy   Complete by: As directed    Increase activity slowly   Complete by: As directed       Allergies as of 07/23/2022       Reactions   Peanuts [peanut Oil] Anaphylaxis   Tylenol [acetaminophen] Itching        Medication List     STOP taking these medications    cyclobenzaprine 5 MG tablet Commonly known as: FLEXERIL   HYDROcodone-acetaminophen 5-325 MG tablet Commonly known as: Norco   ibuprofen 600 MG tablet Commonly known as: ADVIL       TAKE these medications      Indication  ARIPiprazole 5 MG tablet Commonly known as: ABILIFY Take 1 tablet (5 mg total) by mouth daily.  Indication: Major Depressive Disorder   escitalopram 10 MG tablet Commonly known as: LEXAPRO Take 1 tablet (10 mg total) by mouth daily.  Indication: Major Depressive Disorder   hydrOXYzine 25 MG tablet Commonly known as: ATARAX Take 1 tablet (25 mg total) by mouth every 6 (six) hours as needed for anxiety.  Indication: Feeling Anxious   nicotine polacrilex 2 MG gum Commonly known as: NICORETTE Take 1 each (2 mg total) by mouth as needed for smoking cessation.  Indication: Nicotine Addiction   prazosin 1 MG capsule Commonly known as: MINIPRESS Take 1 capsule (1 mg total) by mouth at bedtime.  Indication: Frightening Dreams   traZODone 50 MG tablet Commonly known as: DESYREL Take 1 tablet (50 mg total) by mouth at bedtime.  Indication: Trouble Sleeping        Follow-up Information     [facility] Go on 07/25/2022.   Why: You have a hospital follow up appointment on 07/25/22 at 10:00 am. The appointment will be held in person.  Following this initial appointment, you will be scheduled for a clinical assessment to obtain  necessary therapy and medication management  services. Please let them know you are interested in additional substance use services.  They will connect you with the [facility] program. Contact information: [address] [place] [phone]  [facility] Follow up.   Why: If you cannot get an appointment with your primary care physician, you can walk in to this clinic for medical care. Contact information: [address] [place] [address] [phone]                Discharge recommendations:   Activity: as tolerated  Diet: heart healthy  # It is recommended to the patient to continue psychiatric medications as prescribed, after discharge from the hospital.     # It is recommended to the patient to follow up with your outpatient psychiatric provider and PCP.   # It was discussed with the patient, the impact of alcohol, drugs, tobacco have been there overall psychiatric and medical wellbeing, and total abstinence from substance use was recommended the patient.ed.   # Prescriptions provided or sent directly to preferred pharmacy at discharge. Patient agreeable to plan. Given opportunity to ask questions. Appears to feel comfortable with discharge.    # In the event of worsening symptoms, the patient is instructed to call the crisis hotline, 911 and or go to the nearest ED for appropriate evaluation and treatment of symptoms. To follow-up with primary care provider for other medical issues, concerns and or health care needs   # Patient was discharged home with a plan to follow up as noted above.  -Follow-up with outpatient primary care doctor and other specialists -for management of chronic medical disease, including: patient was instructed to contact and see his pcp within 7 days of dc to address elevated bl pr, he was also given resources for walk in community health clinic.    Patient agrees with D/C instructions and  plan.   The patient received suicide prevention pamphlet:  Yes Belongings returned:  Clothing and Valuables  Total Time Spent in Direct Patient Care:  I personally spent 45 minutes on the unit in direct patient care. The direct patient care time included face-to-face time with the patient, reviewing the patient's chart, communicating with other professionals, and coordinating care. Greater than 50% of this time was spent in counseling or coordinating care with the patient regarding goals of hospitalization, psycho-education, and discharge planning needs.    SignedSarita Bottom, MD 07/23/2022, 11:25 AM

## 2022-07-23 NOTE — Progress Notes (Signed)
   07/23/22 1000  Psych Admission Type (Psych Patients Only)  Admission Status Involuntary  Psychosocial Assessment  Patient Complaints Anxiety  Eye Contact Brief  Facial Expression Anxious  Affect Anxious  Speech Logical/coherent  Interaction Assertive  Motor Activity Other (Comment) (WNL)  Appearance/Hygiene Unremarkable  Behavior Characteristics Appropriate to situation  Mood Anxious  Thought Process  Coherency WDL  Content WDL  Delusions None reported or observed  Perception WDL  Hallucination None reported or observed  Judgment WDL  Confusion WDL  Danger to Self  Current suicidal ideation? Denies  Agreement Not to Harm Self Yes  Description of Agreement Verbal  Danger to Others  Danger to Others None reported or observed

## 2022-07-23 NOTE — Progress Notes (Signed)
  [facility] Adult Case Management Discharge Plan :  Will you be returning to the same living situation after discharge:  Yes,  with family At discharge, do you have transportation home?: Yes,  family Do you have the ability to pay for your medications: No.  Will need help from community agency  Release of information consent forms completed and emailed to Medical Records, then turned in to Medical Records by CSW.   Patient to Follow up at:  Follow-up Information     [facility] Go on 07/25/2022.   Why: You have a hospital follow up appointment on 07/25/22 at 10:00 am. The appointment will be held in person.  Following this initial appointment, you will be scheduled for a clinical assessment to obtain  necessary therapy and medication management services. Please let them know you are interested in additional substance use services.  They will connect you with the [facility] program. Contact information: [address] [place] [address] [phone]         [facility] Follow up.   Why: If you cannot get an appointment with your primary care physician, you can walk in to this clinic for medical care. Contact information: [address] [place] [address] [phone]                Next level of care provider has access to [facility] Link:no  Safety Planning and Suicide Prevention discussed: Yes,  with sister     Has patient been referred to the Quitline?: Patient refused referral  Patient has been referred for addiction treatment: Yes  Lynnell Chad, LCSW 07/23/2022, 9:14 AM

## 2022-07-23 NOTE — Progress Notes (Signed)
Patient discharged. Reviewed discharge instructions. Patient verbalized understanding. Patient received all personal belongings. Patient left unit at 1111.

## 2023-01-05 ENCOUNTER — Ambulatory Visit: Payer: Medicaid Other

## 2023-08-05 ENCOUNTER — Other Ambulatory Visit: Payer: Self-pay

## 2023-08-05 ENCOUNTER — Emergency Department: Payer: Self-pay

## 2023-08-05 ENCOUNTER — Emergency Department
Admission: EM | Admit: 2023-08-05 | Discharge: 2023-08-05 | Disposition: A | Discharge status: Home / Self Care | Payer: Self-pay | Attending: Emergency Medicine | Admitting: Emergency Medicine

## 2023-08-05 DIAGNOSIS — R079 Chest pain, unspecified: Secondary | ICD-10-CM | POA: Insufficient documentation

## 2023-08-05 LAB — CBC
HCT: 42.4 % (ref 39.0–52.0)
Hemoglobin: 14.2 g/dL (ref 13.0–17.0)
MCH: 34 pg (ref 26.0–34.0)
MCHC: 33.5 g/dL (ref 30.0–36.0)
MCV: 101.4 fL — ABNORMAL HIGH (ref 80.0–100.0)
Platelets: 352 10*3/uL (ref 150–400)
RBC: 4.18 MIL/uL — ABNORMAL LOW (ref 4.22–5.81)
RDW: 11.7 % (ref 11.5–15.5)
WBC: 9.4 10*3/uL (ref 4.0–10.5)
nRBC: 0 % (ref 0.0–0.2)

## 2023-08-05 LAB — BASIC METABOLIC PANEL
Anion gap: 12 (ref 5–15)
BUN: 11 mg/dL (ref 6–20)
CO2: 21 mmol/L — ABNORMAL LOW (ref 22–32)
Calcium: 8.8 mg/dL — ABNORMAL LOW (ref 8.9–10.3)
Chloride: 101 mmol/L (ref 98–111)
Creatinine, Ser: 0.97 mg/dL (ref 0.61–1.24)
GFR, Estimated: >60 mL/min (ref >60)
Glucose, Bld: 90 mg/dL (ref 70–99)
Potassium: 3.6 mmol/L (ref 3.5–5.1)
Sodium: 134 mmol/L — ABNORMAL LOW (ref 135–145)

## 2023-08-05 LAB — TROPONIN I (HIGH SENSITIVITY): Troponin I (High Sensitivity): 3 ng/L (ref <18)

## 2023-08-05 NOTE — ED Provider Triage Note (Signed)
Emergency Medicine Provider Triage Evaluation Note  Austin Owen , a 32 y.o. male  was evaluated in triage.  Pt complains of chest pain since christmas. No radiation to the arm, states it radiates to the head. Reports he tried to help his pain with some beer and some drugs. He states he has a history of high blood pressure was prescribed medication and did not take it because he "didn't take the doctor seriously".  Review of Systems  Positive: Chest pain, SOB Negative: Cough, congestion  Physical Exam  There were no vitals taken for this visit. Gen:   Awake, no distress   Resp:  Normal effort  MSK:   Moves extremities without difficulty  Other:    Medical Decision Making  Medically screening exam initiated at 3:09 PM.  Appropriate orders placed.  Austin Owen was informed that the remainder of the evaluation will be completed by another provider, this initial triage assessment does not replace that evaluation, and the importance of remaining in the ED until their evaluation is complete.    Cameron Ali, PA-C 08/05/23 1513

## 2023-08-05 NOTE — ED Triage Notes (Addendum)
Pt to ed from home via POV for CP since 12/25. Pt states "my old lady made me come". Pt is caox4, in no acute distress and ambulatory in triage. Pt then states "I delf medicated with beer and drugs". Pt is supposed to be on HTN meds but can't afford to pay for them due to no job and no insurance.

## 2023-08-05 NOTE — ED Provider Notes (Signed)
[facility] Emergency Department Provider Note     Event Date/Time   First MD Initiated Contact with Patient 08/05/23 1832     (approximate)   History   Chest Pain   HPI  Ahmed Nile is a 32 y.o. male with a history of alcohol use disorder, cocaine use disorder, PTSD, and major depressive disorder, presents to the ED [place] by his significant other.  She advised patient to report to the ED after he began to complain about chest pain since December 25.  He presents to the ED today for evaluation.  He confirms that he is only here because of his significant other.  Also would report that he medicate himself with beer and drugs.  He has not been able to for previously prescribed blood pressure medicines, citing lack of insurance coverage and income.   Physical Exam   Triage Vital Signs: ED Triage Vitals  Encounter Vitals Group     BP 08/05/23 1520 (!) 145/97     Systolic BP Percentile --      Diastolic BP Percentile --      Pulse Rate 08/05/23 1520 (!) 125     Resp 08/05/23 1520 20     Temp 08/05/23 1520 98.3 F (36.8 C)     Temp Source 08/05/23 1520 Oral     SpO2 08/05/23 1520 98 %     Weight --      Height --      Head Circumference --      Peak Flow --      Pain Score 08/05/23 1510 4     Pain Loc --      Pain Education --      Exclude from Growth Chart --     Most recent vital signs: Vitals:   08/05/23 1520 08/05/23 1852  BP: (!) 145/97 135/89  Pulse: (!) 125 96  Resp: 20 18  Temp: 98.3 F (36.8 C)   SpO2: 98% 98%    General Awake, no distress. NAD HEENT NCAT. PERRL. EOMI. No rhinorrhea. Mucous membranes are moist.  CV:  Good peripheral perfusion. RRR RESP:  Normal effort. CTA ABD:  No distention.    ED Results / Procedures / Treatments   Labs (all labs ordered are listed, but only abnormal results are displayed) Labs Reviewed  CBC - Abnormal; Notable for the following components:      Result Value   RBC 4.18  (*)    MCV 101.4 (*)    All other components within normal limits  BASIC METABOLIC PANEL - Abnormal; Notable for the following components:   Sodium 134 (*)    CO2 21 (*)    Calcium 8.8 (*)    All other components within normal limits  TROPONIN I (HIGH SENSITIVITY)     EKG  Vent. rate 134 BPM PR interval 150 ms QRS duration 84 ms QT/QTcB 284/424 ms P-R-T axes 88 73 15 Sinus tachycardia Nonspecific ST and T wave abnormality Abnormal ECG When compared with ECG of 27-Feb-2020 23:42,  RADIOLOGY  I personally viewed and evaluated these images as part of my medical decision making, as well as reviewing the written report by the radiologist.  ED Provider Interpretation: No acute findings  DG Chest 2 View Result Date: 08/05/2023 CLINICAL DATA:  Chest pain. EXAM: CHEST - 2 VIEW COMPARISON:  None Available. FINDINGS: Lungs are clear without airspace disease or consolidation. Heart and mediastinum are within normal limits. Trachea is midline. Negative for a  pneumothorax. No pleural effusions. No acute bone abnormality. IMPRESSION: No active cardiopulmonary disease. Electronically Signed   By: Richarda Overlie M.D.   On: 08/05/2023 16:52     PROCEDURES:  Critical Care performed: No  Procedures   MEDICATIONS ORDERED IN ED: Medications - No data to display   IMPRESSION / MDM / ASSESSMENT AND PLAN / ED COURSE  I reviewed the triage vital signs and the nursing notes.                              Differential diagnosis includes, but is not limited to, Differential diagnosis includes, but is not limited to, ACS, aortic dissection, pulmonary embolism, cardiac tamponade, pneumothorax, pneumonia, pericarditis, myocarditis, GI-related causes including esophagitis/gastritis, and musculoskeletal chest wall pain.    Patient's presentation is most consistent with acute complicated illness / injury requiring diagnostic workup.  Patient's diagnosis is consistent with nonspecific chest pain at  this presentation.  He is stable, afebrile, without acute lab abnormalities.  Vital signs are stable with reassuring workup at this time.  Low suspicion for PE or cardiopulmonary source of the patient's chest pain.  No acute lab abnormalities are noted.  Troponin is normal and I doubt ACS given the patient's 5-day complaint of intermittent symptoms.  No EKG evidence of malignant arrhythmia on exam.  Chest is read shows no evidence of any acute intrathoracic process.  Is also advised that symptoms may be due to ongoing intermittent narcotic use in the form of cocaine. Patient is to follow up with a provider from the local resource list as discussed, as needed or otherwise directed. Patient is given ED precautions to return to the ED for any worsening or new symptoms.  FINAL CLINICAL IMPRESSION(S) / ED DIAGNOSES   Final diagnoses:  Nonspecific chest pain     Rx / DC Orders   ED Discharge Orders     None        Note:  This document was prepared using Dragon voice recognition software and may include unintentional dictation errors.    Lissa Hoard, PA-C 08/05/23 2105    Trinna Post, MD 08/05/23 2322

## 2023-08-05 NOTE — Discharge Instructions (Addendum)
Your exam, labs, EKG, and chest x-ray are normal and reassuring at this time.  No evidence of uncontrolled hypertension on presentation.  No evidence of an abnormal heart rhythm or damage to your heart muscle.  You should follow-up with your primary provider for ongoing evaluation of your symptoms.  You should also follow-up with one of the local community centers for drug and alcohol rehab as discussed.

## 2024-01-03 ENCOUNTER — Ambulatory Visit: Admitting: Family Medicine

## 2024-01-03 DIAGNOSIS — R4586 Emotional lability: Secondary | ICD-10-CM | POA: Insufficient documentation

## 2024-01-03 DIAGNOSIS — Z202 Contact with and (suspected) exposure to infections with a predominantly sexual mode of transmission: Secondary | ICD-10-CM | POA: Insufficient documentation

## 2024-01-03 DIAGNOSIS — Z113 Encounter for screening for infections with a predominantly sexual mode of transmission: Secondary | ICD-10-CM | POA: Insufficient documentation

## 2024-01-03 LAB — HM HIV SCREENING LAB: HM HIV Screening: NEGATIVE

## 2024-01-03 LAB — HM HEPATITIS C SCREENING LAB: HM Hepatitis Screen: NEGATIVE

## 2024-01-03 LAB — HEPATITIS B SURFACE ANTIGEN

## 2024-01-03 MED ORDER — METRONIDAZOLE 500 MG PO TABS
2000.0000 mg | ORAL_TABLET | Freq: Once | ORAL | 0 refills | Status: AC
Start: 2024-01-03 — End: 2024-01-03

## 2024-01-03 NOTE — Progress Notes (Addendum)
 Pt is here for std screening, contact to trich. The patient was dispensed metronidazole #4 today. I provided counseling today regarding the medication. We discussed the medication, the side effects and when to call clinic. Patient given the opportunity to ask questions. Questions answered.  Condoms given. Caren Channel, RN

## 2024-01-03 NOTE — Assessment & Plan Note (Signed)
 Patient reports some anxious and depressed mood. Amenable to counseling referral.

## 2024-01-03 NOTE — Progress Notes (Signed)
 [facility] Department STI clinic [address] [place] [address] Main phone: [phone]  STI screening visit  Subjective:  Austin Owen is a 33 y.o. male being seen today for an STI screening visit. The patient reports they do not have symptoms.    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 Denies any symptoms. No concerns for other STI exposures.   See flowsheet for further details and programmatic requirements  Hyperlink available at the top of the signed note in blue.  Flow sheet content below:  Pregnancy Intention Screening Does the patient want to become pregnant in the next year?: No Does the patient's partner want to become pregnant in the next year?: No Would the patient like to discuss contraceptive options today?: N/A Reason For STD Screen STD Screening: Is asymptomatic Have you ever had an STD?: Yes History of Antibiotic use in the past 2 weeks?: No STD Symptoms Denies all: Yes Risk Factors for Hep B Household, sexual, or needle sharing contact of a person infected with Hep B: No Sexual contact with a person who uses drugs not as prescribed?: Yes Currently or Ever used drugs not as prescribed: Yes HIV Positive: No PRep Patient: No Men who have sex with men: No Have Hepatitis C: No History of Incarceration: No History  of Homeslessness?: No Anal sex following anal drug use?: N/A Risk Factors for Hep C Currently using drugs not as prescribed: Yes Sexual partner(s) currently using drugs as not prescribed: No History of drug use: No HIV Positive: No People with a history of incarceration: No People born between the years of 22 and 3: No Abuse History Does patient feel they have a problem with Anxiety?: Yes Does patient feel they have a problem with Depression?: Yes Referral to Behavioral Health: Yes Counseling Medication side effects discussed with patient?: Yes Contact card(s) given to patient: No Patient counseled to abstain from sex for: 7 days Patient counseled to avoid alcohol for?: 5 days Patient counseled to use condoms with all sex: Condoms given RTC in 2-3 weeks for test results: Yes Clinic will call if test results abnormal before test result appt.: Yes Patient should return to the clinic for re-treatment if vomits within 2 hours after taking meds   : Yes Patient to return to the clinic in 3 months for TOC: Yes BCM given today through [facility] clinic: No Immunizations: Referred Test results given to patient Patient counseled to use condoms with all sex: Condoms given STD Treatment Patient counseled to abstain from sex for: 7 days Patient counseled to avoid alcohol for?: 5 days  Screening for MPX risk: Does the patient have an unexplained rash? No Is the patient MSM? No Does the patient endorse multiple sex partners or anonymous sex partners? No Did the patient have close or sexual contact with a person diagnosed with MPX? No Has the patient traveled outside the [place]  where MPX is  endemic? No Is there a high clinical suspicion for MPX-- evidenced by one of the following No  -Unlikely to be chickenpox  -Lymphadenopathy  -Rash that present in same phase of evolution on any given body part  STI screening history: Last HIV test per patient/review of record was No results found for:  "HMHIVSCREEN"  Lab Results  Component Value Date   HIV Non Reactive 07/20/2022    Last HEPC test per patient/review of record was No results found for: "HMHEPCSCREEN" No components found for: "HEPC"   Last HEPB test per patient/review of record was No components found for: "HMHEPBSCREEN"   Fertility: Does the patient or their partner desires a pregnancy in the next year? No  There is no immunization history on file for this patient.  The following portions of the patient's history were reviewed and updated as appropriate: allergies, current medications, past medical history, past social history, past surgical history and problem list.  Objective:  There were no vitals filed for this visit.  Physical Exam Vitals and nursing note reviewed.  Constitutional:      General: He is not in acute distress.    Appearance: Normal appearance. He is normal weight. He is not ill-appearing or diaphoretic.  HENT:     Head: Normocephalic and atraumatic.     Mouth/Throat:     Mouth: Mucous membranes are moist.     Pharynx: No oropharyngeal exudate or posterior oropharyngeal erythema.  Eyes:     General: No scleral icterus.       Right eye: No discharge.        Left eye: No discharge.     Conjunctiva/sclera: Conjunctivae normal.     Right eye: Right conjunctiva is not injected. No exudate.    Left eye: Left conjunctiva is not injected. No exudate. Pulmonary:     Effort: Pulmonary effort is normal.  Abdominal:     Palpations: There is no hepatomegaly.  Genitourinary:    Comments: Declined genital exam- asymptomatic Musculoskeletal:     Cervical back: Neck supple. No rigidity or tenderness.  Lymphadenopathy:     Head:     Right side of head: No submandibular, preauricular or posterior auricular adenopathy.     Left side of head: No submandibular, preauricular or posterior auricular adenopathy.     Cervical: No cervical adenopathy.     Right cervical: No superficial or posterior cervical  adenopathy.    Left cervical: No superficial or posterior cervical adenopathy.     Upper Body:     Right upper body: No supraclavicular adenopathy.     Left upper body: No supraclavicular adenopathy.  Skin:    General: Skin is warm and dry.     Capillary Refill: Capillary refill takes less than 2 seconds.  Neurological:     General: No focal deficit present.     Mental Status: He is alert and oriented to person, place, and time.  Psychiatric:        Mood and Affect: Mood normal.        Behavior: Behavior normal.    Assessment and Plan:  Austin Owen is a 33 y.o. male presenting to the [facility] Department for STI screening  Screening for venereal disease -     Chlamydia/GC NAA, Confirmation -     Gonococcus culture -     HBV Antigen/Antibody State Lab -     HIV/HCV [place] [facility] -     Syphilis Serology, [facility]  Trichomonas contact Assessment &  Plan: Will treat with metronidazole 2g once.   Orders: -     metroNIDAZOLE; Take 4 tablets (2,000 mg total) by mouth once for 1 dose.  Dispense: 4 tablet; Refill: 0  Mood changes Assessment & Plan: Patient reports some anxious and depressed mood. Amenable to counseling referral.   Orders: -     Ambulatory referral to Behavioral Health   Patient does not have STI symptoms Patient accepted the following screenings: HIV, RPR, Hep B, and Hep C, urine G/C Patient meets criteria for HepB screening? Yes. Ordered? yes Patient meets criteria for HepC screening? Yes. Ordered? yes Recommended condom use with all sex Discussed importance of condom use for STI prevention  Treat positive test results per standing order. Discussed time line for State Lab results and that patient will be called with positive results and encouraged patient to call if he had not heard in 2 weeks Recommended repeat testing in 3 months with positive results. Recommended returning for continued or worsening symptoms.   No follow-ups on  file.  No future appointments.  Jack Marts, MD

## 2024-01-03 NOTE — Assessment & Plan Note (Signed)
 Will treat with metronidazole 2g once.

## 2024-01-05 LAB — CHLAMYDIA/GC NAA, CONFIRMATION
Chlamydia trachomatis, NAA: NEGATIVE
Neisseria gonorrhoeae, NAA: NEGATIVE

## 2024-01-08 LAB — GONOCOCCUS CULTURE

## 2024-06-04 ENCOUNTER — Encounter: Payer: Self-pay | Admitting: Emergency Medicine

## 2024-06-04 ENCOUNTER — Other Ambulatory Visit: Payer: Self-pay

## 2024-06-04 ENCOUNTER — Emergency Department
Admission: EM | Admit: 2024-06-04 | Discharge: 2024-06-04 | Payer: Self-pay | Attending: Emergency Medicine | Admitting: Emergency Medicine

## 2024-06-04 DIAGNOSIS — Z041 Encounter for examination and observation following transport accident: Secondary | ICD-10-CM | POA: Insufficient documentation

## 2024-06-04 DIAGNOSIS — Z008 Encounter for other general examination: Secondary | ICD-10-CM | POA: Diagnosis not present

## 2024-06-04 NOTE — ED Notes (Signed)
 Pt being verbally agreesive to [employer] officers and surrounding staff. Pt refusing EDP assessment multiple times.
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
